# Patient Record
Sex: Male | Born: 1980
Health system: Southern US, Community
[De-identification: ages and names within clinical notes are randomized; demographics above are authoritative.]

## PROBLEM LIST (undated history)

## (undated) DIAGNOSIS — F319 Bipolar disorder, unspecified: Secondary | ICD-10-CM

## (undated) DIAGNOSIS — A630 Anogenital (venereal) warts: Secondary | ICD-10-CM

## (undated) DIAGNOSIS — F32A Depression, unspecified: Secondary | ICD-10-CM

## (undated) DIAGNOSIS — K219 Gastro-esophageal reflux disease without esophagitis: Secondary | ICD-10-CM

## (undated) DIAGNOSIS — B019 Varicella without complication: Secondary | ICD-10-CM

## (undated) DIAGNOSIS — F329 Major depressive disorder, single episode, unspecified: Secondary | ICD-10-CM

## (undated) HISTORY — DX: Varicella without complication: B01.9

## (undated) HISTORY — DX: Bipolar disorder, unspecified: F31.9

## (undated) HISTORY — PX: HERNIA REPAIR: SHX51

## (undated) HISTORY — DX: Anogenital (venereal) warts: A63.0

## (undated) HISTORY — DX: Major depressive disorder, single episode, unspecified: F32.9

## (undated) HISTORY — DX: Depression, unspecified: F32.A

## (undated) HISTORY — DX: Gastro-esophageal reflux disease without esophagitis: K21.9

---

## 2012-09-28 ENCOUNTER — Ambulatory Visit (INDEPENDENT_AMBULATORY_CARE_PROVIDER_SITE_OTHER): Payer: 59 | Admitting: Adult Health

## 2012-09-28 ENCOUNTER — Encounter: Payer: Self-pay | Admitting: Adult Health

## 2012-09-28 VITALS — BP 116/76 | HR 78 | Temp 98.5°F | Resp 14 | Ht 68.0 in | Wt 212.0 lb

## 2012-09-28 DIAGNOSIS — Z0000 Encounter for general adult medical examination without abnormal findings: Secondary | ICD-10-CM

## 2012-09-28 DIAGNOSIS — Z8619 Personal history of other infectious and parasitic diseases: Secondary | ICD-10-CM

## 2012-09-28 DIAGNOSIS — N5089 Other specified disorders of the male genital organs: Secondary | ICD-10-CM | POA: Insufficient documentation

## 2012-09-28 DIAGNOSIS — Z23 Encounter for immunization: Secondary | ICD-10-CM | POA: Insufficient documentation

## 2012-09-28 DIAGNOSIS — N508 Other specified disorders of male genital organs: Secondary | ICD-10-CM

## 2012-09-28 DIAGNOSIS — F32A Depression, unspecified: Secondary | ICD-10-CM | POA: Insufficient documentation

## 2012-09-28 DIAGNOSIS — Z113 Encounter for screening for infections with a predominantly sexual mode of transmission: Secondary | ICD-10-CM

## 2012-09-28 DIAGNOSIS — A630 Anogenital (venereal) warts: Secondary | ICD-10-CM

## 2012-09-28 DIAGNOSIS — F329 Major depressive disorder, single episode, unspecified: Secondary | ICD-10-CM | POA: Insufficient documentation

## 2012-09-28 MED ORDER — VALACYCLOVIR HCL 500 MG PO TABS: 500.0000 mg | ORAL_TABLET | Freq: Every day | ORAL | Status: DC

## 2012-09-28 MED ORDER — ESOMEPRAZOLE MAGNESIUM 40 MG PO CPDR: 40.0000 mg | DELAYED_RELEASE_CAPSULE | Freq: Every day | ORAL | Status: DC

## 2012-09-28 MED ORDER — CITALOPRAM HYDROBROMIDE 20 MG PO TABS: 20.0000 mg | ORAL_TABLET | Freq: Every day | ORAL | Status: DC

## 2012-09-28 NOTE — Assessment & Plan Note (Addendum)
Two warts noted on testicles. Culture sent. Patient has had multiple warts in genitals including shaft of penis. STD w/u sent.

## 2012-09-28 NOTE — Assessment & Plan Note (Signed)
Right testicular movable mass palpated. Refer to urology for further w/u.

## 2012-09-28 NOTE — Assessment & Plan Note (Signed)
Vaccine to be given today.

## 2012-09-28 NOTE — Assessment & Plan Note (Signed)
Patient has had feelings of depression for years. Highly functional. Works daily. He reports violent outbursts. No physical injury to self or others. Will start trials of Celexa. Refer to psychiatry for evaluation and help with management.

## 2012-09-28 NOTE — Progress Notes (Signed)
  Subjective:    Patient ID: Nathaniel Ball, male    DOB: [birth year], 32 y.o.   MRN: [id number]  HPI  Patient is a 32 y/o male who presents to clinic to establish care. He recently moved to this area with his wife and daughter. Patient reports a hx of oral herpes with several outbreaks yearly, GERD, depression with some issues with outbursts of anger, genital warts.  Patient was in the [employer] and served for 3.5 years. He completed his tour in November 2005.  Patient has not received his flu vaccine and does not wish to get one. He is due for his Tdap and is agreeable to this.   Review of Systems  Constitutional: Negative.   HENT: Negative.   Eyes: Negative.   Respiratory: Negative.   Cardiovascular: Negative.   Genitourinary: Negative for frequency, hematuria, flank pain, discharge, difficulty urinating and testicular pain.       Genital warts. Testicular lump.  Musculoskeletal:       Right hamstring tightness every morning upon awakening. This subsides once he gets up and moves around.  Neurological: Negative.   Psychiatric/Behavioral: Positive for decreased concentration. Negative for suicidal ideas and hallucinations. The patient is hyperactive. The patient is not nervous/anxious.        Problems with outbursts of anger. Feeling depressed.        Objective:   Physical Exam  Constitutional: He is oriented to person, place, and time. He appears well-developed and well-nourished. No distress.  HENT:  Head: Normocephalic and atraumatic.  Eyes: Conjunctivae normal are normal. Pupils are equal, round, and reactive to light.  Cardiovascular: Normal rate, regular rhythm and normal heart sounds.  Exam reveals no gallop.   No murmur heard. Pulmonary/Chest: Effort normal and breath sounds normal. No respiratory distress. He has no wheezes. He has no rales. He exhibits no tenderness.  Abdominal: Soft. Bowel sounds are normal. He exhibits no mass. There is no tenderness. There is no  rebound.  Genitourinary: Penis normal. No penile tenderness.       Several testicular warts. Right testicular movable nodule.  Musculoskeletal: Normal range of motion. He exhibits no edema and no tenderness.  Lymphadenopathy:    He has no cervical adenopathy.  Neurological: He is alert and oriented to person, place, and time. No cranial nerve deficit. Coordination normal.  Skin: Skin is warm and dry.  Psychiatric: He has a normal mood and affect. His behavior is normal. Judgment and thought content normal.        Assessment & Plan:

## 2012-09-28 NOTE — Patient Instructions (Addendum)
  Thank you for choosing [facility] for your health care needs.  Please return within the next week or two for you fasting blood work. You need to be fasting for 8 hours prior.  I am referring you to Psychiatry for evaluation and management of depression and ADD.  I am also referring you to Urology for evaluation of testicular nodule.  We will notify you of your lab results once we receive the results.  I have sent your prescriptions we discussed to your pharmacy.

## 2012-09-28 NOTE — Assessment & Plan Note (Signed)
Patient presents to establish care. Recently moved to this state from [place]. Labs ordered today include cbc w/diff, lipid panel, cmet. Patient is not fasting so he will return within the next week or so for fasting labs.

## 2012-09-28 NOTE — Assessment & Plan Note (Signed)
Reports multiple oral outbreaks yearly. None in genitals. Start Valtrex for suppression.

## 2012-09-29 LAB — HSV(HERPES SIMPLEX VRS) I + II AB-IGM: Herpes Simplex Vrs I&II-IgM Ab (EIA): 0.46 INDEX

## 2012-09-29 LAB — HIV ANTIBODY (ROUTINE TESTING W REFLEX): HIV: NONREACTIVE

## 2012-09-29 MED ORDER — TETANUS-DIPHTH-ACELL PERTUSSIS 5-2.5-18.5 LF-MCG/0.5 IM SUSP
0.5000 mL | Freq: Once | INTRAMUSCULAR | Status: AC
  Administered 2012-09-29: 0.5 mL via INTRAMUSCULAR

## 2012-09-30 LAB — CHLAMYDIA/GONOCOCCUS/TRICHOMONAS, NAA: Gonococcus by NAA: NEGATIVE

## 2012-10-01 ENCOUNTER — Other Ambulatory Visit: Payer: Self-pay | Admitting: Adult Health

## 2012-10-01 MED ORDER — PODOFILOX 0.5 % EX GEL: Freq: Two times a day (BID) | CUTANEOUS | Status: DC

## 2012-10-01 NOTE — Progress Notes (Signed)
Ordered Podofilox gel 0.5% - apply bid x 3 days then 4 days of no treatment. May repeat x 3 cycles.

## 2012-10-02 ENCOUNTER — Telehealth: Payer: Self-pay | Admitting: *Deleted

## 2012-10-02 NOTE — Telephone Encounter (Signed)
Called patient no answer. Message was left

## 2012-10-12 ENCOUNTER — Other Ambulatory Visit (INDEPENDENT_AMBULATORY_CARE_PROVIDER_SITE_OTHER): Payer: 59

## 2012-10-12 DIAGNOSIS — Z0000 Encounter for general adult medical examination without abnormal findings: Secondary | ICD-10-CM

## 2012-10-13 LAB — LDL CHOLESTEROL, DIRECT: Direct LDL: 105 mg/dL

## 2012-10-13 LAB — COMPREHENSIVE METABOLIC PANEL
Albumin: 4.4 g/dL (ref 3.5–5.2)
Alkaline Phosphatase: 75 U/L (ref 39–117)
BUN: 10 mg/dL (ref 6–23)
CO2: 26 mEq/L (ref 19–32)
Calcium: 9.4 mg/dL (ref 8.4–10.5)
GFR: 117.27 mL/min (ref >60.00)
Glucose, Bld: 83 mg/dL (ref 70–99)
Potassium: 3.8 mEq/L (ref 3.5–5.1)

## 2012-10-13 LAB — LIPID PANEL
HDL: 43.3 mg/dL (ref >39.00)
LDL Cholesterol: 113 mg/dL — ABNORMAL HIGH (ref 0–99)
Total CHOL/HDL Ratio: 4
Triglycerides: 70 mg/dL (ref 0.0–149.0)
VLDL: 14 mg/dL (ref 0.0–40.0)

## 2012-10-13 LAB — CBC WITH DIFFERENTIAL/PLATELET
Basophils Relative: 0.2 % (ref 0.0–3.0)
Eosinophils Relative: 1.9 % (ref 0.0–5.0)
Hemoglobin: 14.1 g/dL (ref 13.0–17.0)
Monocytes Absolute: 0.4 10*3/uL (ref 0.1–1.0)
Monocytes Relative: 7.4 % (ref 3.0–12.0)
Neutrophils Relative %: 67.3 % (ref 43.0–77.0)

## 2012-11-19 ENCOUNTER — Other Ambulatory Visit: Payer: Self-pay | Admitting: Adult Health

## 2012-11-19 MED ORDER — CITALOPRAM HYDROBROMIDE 40 MG PO TABS: 40.0000 mg | ORAL_TABLET | Freq: Every day | ORAL | Status: DC

## 2013-01-27 ENCOUNTER — Encounter: Payer: Self-pay | Admitting: Adult Health

## 2013-01-27 ENCOUNTER — Ambulatory Visit (INDEPENDENT_AMBULATORY_CARE_PROVIDER_SITE_OTHER): Payer: BC Managed Care – PPO | Admitting: Adult Health

## 2013-01-27 VITALS — BP 110/62 | HR 87 | Temp 98.6°F | Resp 12 | Wt 226.5 lb

## 2013-01-27 DIAGNOSIS — M549 Dorsalgia, unspecified: Secondary | ICD-10-CM | POA: Insufficient documentation

## 2013-01-27 DIAGNOSIS — J029 Acute pharyngitis, unspecified: Secondary | ICD-10-CM | POA: Insufficient documentation

## 2013-01-27 DIAGNOSIS — R109 Unspecified abdominal pain: Secondary | ICD-10-CM

## 2013-01-27 MED ORDER — AZITHROMYCIN 250 MG PO TABS: ORAL_TABLET | ORAL | Status: DC

## 2013-01-27 MED ORDER — CEFTRIAXONE SODIUM 1 G IJ SOLR
1.0000 g | Freq: Once | INTRAMUSCULAR | Status: AC
  Administered 2013-01-27: 1 g via INTRAMUSCULAR

## 2013-01-27 NOTE — Progress Notes (Signed)
  Subjective:    Patient ID: Nathaniel Ball, male    DOB: [birth year], 32 y.o.   MRN: [id number]  HPI  Patient presents to clinic with c/o sore throat. He is unable to recall when his sore throat began. He knows it is greater than 1 week. It is painful to swallow. He has not noted any exudate. He denies any sick contacts. Denies fever or chills. He has not tried any over-the-counter medications.  Patient complains of upper right scapula pain and left middle back pain which has been ongoing for greater than 3 months. Pain is improved with rest. Pain is worse with certain movements. Pain is not constant. Work involved lifting up to 50 lbs. He reports that the scapula pain began with repetitive motions required with his work. He does not recall when the left middle back pain began. He denies hematuria, fever or difficulty urinating. He reports no personal history of kidney stones.   Current Outpatient Prescriptions on File Prior to Visit  Medication Sig Dispense Refill  . esomeprazole (NEXIUM) 40 MG capsule Take 1 capsule (40 mg total) by mouth daily.  30 capsule  6  . valACYclovir (VALTREX) 500 MG tablet Take 1 tablet (500 mg total) by mouth daily.  30 tablet  6   No current facility-administered medications on file prior to visit.     Review of Systems  Constitutional: Negative for fever and chills.  HENT: Positive for sore throat. Negative for congestion, mouth sores and trouble swallowing.   Respiratory: Negative for cough, shortness of breath and wheezing.   Cardiovascular: Negative for chest pain.  Musculoskeletal: Positive for back pain. Negative for joint swelling and arthralgias.       Objective:   Physical Exam  Constitutional: He is oriented to person, place, and time.  Overweight male in no apparent distress  HENT:  Head: Normocephalic and atraumatic.  Erythema of the pharynx without exudate.  Cardiovascular: Normal rate, regular rhythm and normal heart sounds.  Exam reveals  no gallop.   No murmur heard. Pulmonary/Chest: Effort normal and breath sounds normal. No respiratory distress. He has no wheezes. He has no rales.  Musculoskeletal: He exhibits tenderness. He exhibits no edema.  Tenderness with palpation over left scapula area. There is no costovertebral tenderness.  Lymphadenopathy:    He has no cervical adenopathy.  Neurological: He is alert and oriented to person, place, and time.  Skin: Skin is warm and dry.  Psychiatric: He has a normal mood and affect. His behavior is normal.          Assessment & Plan:

## 2013-01-27 NOTE — Assessment & Plan Note (Signed)
Rocephin IM given in the office. Azithromycin to start tomorrow. Patient is to return to clinic if symptoms do not improve with treatment.

## 2013-01-27 NOTE — Assessment & Plan Note (Signed)
2 separate areas of back pain. Suspect back pain is muscular in origin given his presentation and report. Right middle back pain may be compensatory versus UTI versus kidney stone. Urinalysis sent. Start ibuprofen 600 mg every 6 hours for the next 3-4 days. Recommended ice alternating with heat for 15 minute intervals 3-4 times a day. If symptoms do not improve may benefit from physical therapy.

## 2013-01-27 NOTE — Patient Instructions (Addendum)
  We are giving you a shot of ceftriaxone 1gm today.  Then start the Azithromycin tomorrow as instructed - 2 tablets tomorrow then 1 tablet daily for the next 4 days.  The office will contact you once we get the results of your urinalysis.  For your back pain, take ibuprofen 600 mg every 6 hours for 3-4 days.  Apply ice alternating with heat for 15 min at a time. Try to do this at least 2-3 times daily for the next 3-4 days.

## 2013-01-28 LAB — URINALYSIS, ROUTINE W REFLEX MICROSCOPIC
Ketones, ur: NEGATIVE
Leukocytes, UA: NEGATIVE
Specific Gravity, Urine: >=1.03 (ref 1.000–1.030)
Urine Glucose: NEGATIVE
pH: 6 (ref 5.0–8.0)

## 2013-01-29 ENCOUNTER — Other Ambulatory Visit: Payer: Self-pay | Admitting: Adult Health

## 2013-01-29 DIAGNOSIS — R3129 Other microscopic hematuria: Secondary | ICD-10-CM

## 2013-04-27 ENCOUNTER — Ambulatory Visit (INDEPENDENT_AMBULATORY_CARE_PROVIDER_SITE_OTHER): Payer: BC Managed Care – PPO | Admitting: Adult Health

## 2013-04-27 ENCOUNTER — Encounter: Payer: Self-pay | Admitting: Adult Health

## 2013-04-27 VITALS — BP 110/64 | HR 83 | Temp 98.3°F | Resp 12 | Wt 224.0 lb

## 2013-04-27 DIAGNOSIS — K625 Hemorrhage of anus and rectum: Secondary | ICD-10-CM | POA: Insufficient documentation

## 2013-04-27 DIAGNOSIS — R109 Unspecified abdominal pain: Secondary | ICD-10-CM

## 2013-04-27 DIAGNOSIS — R079 Chest pain, unspecified: Secondary | ICD-10-CM

## 2013-04-27 DIAGNOSIS — R0789 Other chest pain: Secondary | ICD-10-CM

## 2013-04-27 NOTE — Assessment & Plan Note (Addendum)
Patient reports ongoing intermittent chest pain for approximately 2 months. Appears to be muscular in origin. EKG normal. Check labs: troponin, crp, sed rate, bmet, cbc. On exam there was point tenderness medial to the left nipple. He works out and does chest exercises. Recommend continue to work out but avoid chest exercise for a few weeks. Take ibuprofen 600 mg tid x 7-10 days. If pain persists, refer for cardiac eval.

## 2013-04-27 NOTE — Patient Instructions (Addendum)
  Failure chest pain:    I am doing some blood work. Once the results are available we will contact you  Your EKG was normal.  Take ibuprofen 600 mg 3 times a day for approximately 7-10 days.  While exercising, avoid flies, bench press - this is only for a short period of time  I suspect this pain is muscular in nature. If symptoms continue I will refer you to cardiology.  For the rectal bleeding:   Stool for occult blood was negative - this means there was no blood in the stool in the rectum.  I suspect this is possibly from internal hemorrhoids as I didn't see any externally  You can apply over-the-counter preparation H. Inserts into rectum and apply as indicated.  If you continue to notice this problem, I will refer you to GI for further workup  I am also checking your hemoglobin since you reported you do not look at the toilet and were unable to tell me the quantity of blood.  To evaluate your right flank pain:   I am sending you for a CT of the abdomen to rule out a kidney stone.  The office will contact you with an appointment.

## 2013-04-27 NOTE — Assessment & Plan Note (Signed)
Evaluate for nephrolithiasis. Send for CT. Check bmet.

## 2013-04-27 NOTE — Assessment & Plan Note (Addendum)
Exam did not reveal any external hemorrhoids or immediately inside rectal vault. Hemocult negative. This has resolved per patient. If problem resurfaces will refer to GI. May use preparation H as needed and per directions.

## 2013-04-27 NOTE — Progress Notes (Signed)
  Subjective:    Patient ID: Nathaniel Ball, male    DOB: 1981/07/11, 32 y.o.   MRN: [id number]  HPI  Patient is a pleasant 32 y/o male who presents to clinic with the following concerns:  1) Intermittent chest pain - describes pain on the left side. Pain last ~ 5 min. Occurs spontaneously - not necessarily with activity. Describes pain as dull but enough to know it is present. This has been ongoing for 2 months. Does not recall the very first episode. He is working out. Denies shortness of breath, syncope or near syncope, dizziness. Pain does not radiate.  2) Bright red blood when he wipes rectum. No hx of hemorrhoids that he is aware of. He did not see any blood on the toilet. He describes a small amount on the paper. Denies rectal pain. No pain with sitting. He does report itching. This last occurred 2 weeks ago. He works out and believes there may be some correlation.  3) Right flank pain. Reports pain worse with stretching but also "just there". Family hx of nephrolithiasis. Denies blood in urine. Pain/discomfort is ongoing for 4 months. Denies fever or chills.   Current Outpatient Prescriptions on File Prior to Visit  Medication Sig Dispense Refill  . esomeprazole (NEXIUM) 40 MG capsule Take 1 capsule (40 mg total) by mouth daily.  30 capsule  6  . Multiple Vitamin (MULTIVITAMIN) tablet Take 1 tablet by mouth daily.      . valACYclovir (VALTREX) 500 MG tablet Take 1 tablet (500 mg total) by mouth daily.  30 tablet  6   No current facility-administered medications on file prior to visit.    Review of Systems  Constitutional: Negative for fever and chills.  Cardiovascular: Positive for chest pain.       Describes intermittent pain. Can point to area. No radiating of pain, sob, syncope, dizziness.   Gastrointestinal: Positive for anal bleeding.       Blood when he wipes. Episodes x 2. None seen in ~ 2 weeks. Does not believe he has hemorrhoids.  Genitourinary: Positive for flank pain.  Negative for dysuria, frequency and hematuria.  Neurological: Negative for dizziness, syncope, weakness and light-headedness.  Psychiatric/Behavioral: Negative.   All other systems reviewed and are negative.     Blood pressure 110/64, pulse 83, temperature 98.3 F (36.8 C), temperature source Oral, resp. rate 12, weight 224 lb (101.606 kg), SpO2 97.00%.    Objective:   Physical Exam  Constitutional: He is oriented to person, place, and time.  32 y/o male in NAD  Cardiovascular: Normal rate, regular rhythm, normal heart sounds and intact distal pulses.  Exam reveals no gallop and no friction rub.   No murmur heard. EKG without abnormality noted  Pulmonary/Chest: Effort normal and breath sounds normal. No respiratory distress. He has no wheezes. He has no rales.  Point tenderness left chest area, medial to nipple. No palpable nodule appreciated.  Genitourinary: Rectum normal.  Mild costovertebral discomfort with palpation over right flank.  Neurological: He is alert and oriented to person, place, and time.  Skin: Skin is dry.  Psychiatric: He has a normal mood and affect. His behavior is normal. Judgment and thought content normal.      Assessment & Plan:

## 2013-04-28 ENCOUNTER — Other Ambulatory Visit: Payer: Self-pay | Admitting: Adult Health

## 2013-04-28 LAB — CBC WITH DIFFERENTIAL/PLATELET
Basophils Absolute: 0 10*3/uL (ref 0.0–0.1)
Basophils Relative: 0.4 % (ref 0.0–3.0)
Eosinophils Absolute: 0.1 10*3/uL (ref 0.0–0.7)
Lymphocytes Relative: 25.8 % (ref 12.0–46.0)
MCHC: 34.5 g/dL (ref 30.0–36.0)
MCV: 91.6 fl (ref 78.0–100.0)
Monocytes Absolute: 0.5 10*3/uL (ref 0.1–1.0)
Neutrophils Relative %: 64.2 % (ref 43.0–77.0)
Platelets: 171 10*3/uL (ref 150.0–400.0)
RBC: 4.54 Mil/uL (ref 4.22–5.81)

## 2013-04-28 LAB — BASIC METABOLIC PANEL
BUN: 13 mg/dL (ref 6–23)
Chloride: 105 mEq/L (ref 96–112)
GFR: 118.56 mL/min (ref >60.00)
Potassium: 3.9 mEq/L (ref 3.5–5.1)
Sodium: 137 mEq/L (ref 135–145)

## 2013-04-28 LAB — SEDIMENTATION RATE: Sed Rate: 9 mm/hr (ref 0–22)

## 2013-04-28 LAB — C-REACTIVE PROTEIN: CRP: <0.5 mg/dL (ref 0.5–20.0)

## 2013-04-30 ENCOUNTER — Encounter: Payer: Self-pay | Admitting: *Deleted

## 2013-05-05 ENCOUNTER — Ambulatory Visit: Payer: Self-pay | Admitting: Adult Health

## 2013-05-06 ENCOUNTER — Telehealth: Payer: Self-pay | Admitting: *Deleted

## 2013-05-06 NOTE — Telephone Encounter (Signed)
CT Abdomen and pelvis was normal. Notified pt on voicemail.

## 2013-05-17 ENCOUNTER — Encounter: Payer: Self-pay | Admitting: Adult Health

## 2013-05-20 ENCOUNTER — Observation Stay: Payer: Self-pay | Admitting: Internal Medicine

## 2013-05-20 LAB — COMPREHENSIVE METABOLIC PANEL
Anion Gap: 6 — ABNORMAL LOW (ref 7–16)
BUN: 11 mg/dL (ref 7–18)
Bilirubin,Total: 0.3 mg/dL (ref 0.2–1.0)
Calcium, Total: 9.2 mg/dL (ref 8.5–10.1)
Chloride: 107 mmol/L (ref 98–107)
Co2: 25 mmol/L (ref 21–32)
EGFR (Non-African Amer.): >60
Glucose: 92 mg/dL (ref 65–99)
Osmolality: 275 (ref 275–301)
SGOT(AST): 35 U/L (ref 15–37)
Sodium: 138 mmol/L (ref 136–145)
Total Protein: 7.3 g/dL (ref 6.4–8.2)

## 2013-05-20 LAB — CBC
HCT: 41.8 % (ref 40.0–52.0)
HGB: 14.3 g/dL (ref 13.0–18.0)
MCH: 31.3 pg (ref 26.0–34.0)
MCHC: 34.3 g/dL (ref 32.0–36.0)
RDW: 12.6 % (ref 11.5–14.5)

## 2013-05-20 LAB — URINALYSIS, COMPLETE
Bacteria: NONE SEEN
Blood: NEGATIVE
Leukocyte Esterase: NEGATIVE
Nitrite: NEGATIVE
Ph: 6 (ref 4.5–8.0)
Protein: NEGATIVE
RBC,UR: 8 /HPF (ref 0–5)
Specific Gravity: 1.021 (ref 1.003–1.030)
Squamous Epithelial: NONE SEEN
WBC UR: <1 /HPF (ref 0–5)

## 2013-05-20 LAB — CK TOTAL AND CKMB (NOT AT ARMC)
CK, Total: 288 U/L — ABNORMAL HIGH (ref 35–232)
CK-MB: 3.2 ng/mL (ref 0.5–3.6)

## 2013-05-21 ENCOUNTER — Telehealth: Payer: Self-pay | Admitting: Adult Health

## 2013-05-21 LAB — LIPID PANEL
HDL Cholesterol: 48 mg/dL (ref 40–60)
Ldl Cholesterol, Calc: 74 mg/dL (ref 0–100)
Triglycerides: 92 mg/dL (ref 0–200)

## 2013-05-21 LAB — CK TOTAL AND CKMB (NOT AT ARMC)
CK, Total: 203 U/L (ref 35–232)
CK-MB: 2.2 ng/mL (ref 0.5–3.6)

## 2013-05-21 LAB — TROPONIN I
Troponin-I: <0.02 ng/mL
Troponin-I: <0.02 ng/mL

## 2013-05-21 NOTE — Telephone Encounter (Signed)
Left message for pt to return my call.

## 2013-05-21 NOTE — Telephone Encounter (Signed)
Hospital follow up 10.3.14 °

## 2013-05-24 NOTE — Telephone Encounter (Signed)
Left message for pt to return my call.

## 2013-05-27 ENCOUNTER — Encounter: Payer: Self-pay | Admitting: Cardiovascular Disease

## 2013-05-27 ENCOUNTER — Ambulatory Visit (INDEPENDENT_AMBULATORY_CARE_PROVIDER_SITE_OTHER): Payer: BC Managed Care – PPO | Admitting: Cardiovascular Disease

## 2013-05-27 VITALS — BP 110/82 | HR 77 | Ht 67.0 in | Wt 218.5 lb

## 2013-05-27 DIAGNOSIS — R079 Chest pain, unspecified: Secondary | ICD-10-CM

## 2013-05-27 NOTE — Patient Instructions (Addendum)
Your stress test is normal.  Follow up as needed.  

## 2013-05-27 NOTE — Procedures (Signed)
    Treadmill Stress test  Indication: Atypical chest pain.  Baseline Data:  Resting EKG shows NSR with rate of 78 bpm, no significant ST or T wave changes Resting blood pressure of 110/82 mm Hg Stand bruce protocal was used.  Exercise Data:  Patient exercised for 9 min 24 sec,  Peak heart rate of 159 bpm.  This was 85 % of the maximum predicted heart rate. No symptoms of chest pain or lightheadedness were reported at peak stress or in recovery.  Peak Blood pressure recorded was 168/84 Maximal work level: 10.1 METs.  Heart rate at 3 minutes in recovery was 97 bpm. BP response: Normal HR response: Normal  EKG with Exercise: Sinus tachycardia with no significant ST changes  FINAL IMPRESSION: Normal exercise stress test. No significant EKG changes concerning for ischemia. Good exercise tolerance.  Recommendation: The chest pain seems to be noncardiac.

## 2013-05-28 ENCOUNTER — Ambulatory Visit (INDEPENDENT_AMBULATORY_CARE_PROVIDER_SITE_OTHER): Payer: BC Managed Care – PPO | Admitting: Adult Health

## 2013-05-28 ENCOUNTER — Ambulatory Visit: Payer: BC Managed Care – PPO | Admitting: Adult Health

## 2013-05-28 VITALS — BP 108/66 | HR 78 | Temp 98.2°F | Resp 12 | Wt 215.5 lb

## 2013-05-28 DIAGNOSIS — Z09 Encounter for follow-up examination after completed treatment for conditions other than malignant neoplasm: Secondary | ICD-10-CM

## 2013-05-28 NOTE — Progress Notes (Signed)
Subjective:    Patient ID: Nathaniel Ball, male    DOB: [birth year], 32 y.o.   MRN: [id number]  HPI  Pt is 32 yo male who presents to clinic for hospital admission follow up. Pt states he was admitted for chest pain at [facility] from 05/20/13 - 05/21/13. Pt states the morning the pain began, he had drank 2 Red Bulls which he doesn't usually do.  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  Pt states he went to the gym after work and chest pain and anxiety continued. Pt states he went to the ED to be evaluated and was admitted for observation to the telemetry floor. Per discharge summary, patient had 2 normal EKGs, serial troponins negative, total CK 288, 203 and 175 units/L, lipid profile within normal limits, normal chest x-ray without evidence of cardiopulmonary disease, TSH 2.93, magnesium 2.1 mg/dL.   Pt had treadmill stress test with Dr. Kirke Corin yesterday which was normal.  Pt states chest pain has continued intermittently. He denies syncopal type episodes, lightheadedness or dizziness, nausea or vomiting associated with these episodes. He is able to pinpoint the area on his chest where he experiences intermittent pain. He denies shortness of breath or pain with inspiration.  Pt states he performs repetitive movements at work lifting approximately 15 lb objects onto a table and placing them in boxes. He remains concerned about his chest pain and reports that his father had his first MI at age 74 and that his paternal grandfather died suddenly from an MI at age 37. Patient's wife is a Engineer, civil (consulting) at [employer] and she has discussed with patient about whether he should have more aggressive/invasive testing. Patient reports that he has a followup appointment with Dr. Kirke Corin next week.    Past Medical History  Diagnosis Date  . Depression   . Chicken pox   . GERD (gastroesophageal reflux disease)   . Genital warts     2007     Past Surgical History  Procedure  Laterality Date  . Hernia repair      2005     Family History  Problem Relation Age of Onset  . Diabetes Mother   . Cancer Father     prostate  . Heart disease Father 54    MI  . Hypertension Father   . Diabetes Father   . Depression Father   . Heart disease Maternal Grandfather   . Heart disease Paternal Grandfather 56    Died MI     History   Social History  . Marital Status: Married    Spouse Name: Photographer    Number of Children: N/A  . Years of Education: 12   Occupational History  .     Social History Main Topics  . Smoking status: Former Smoker    Types: Cigarettes  . Smokeless tobacco: Current User    Types: Chew  . Alcohol Use: Yes  . Drug Use: No  . Sexual Activity: Not on file   Other Topics Concern  . Not on file   Social History Narrative  . No narrative on file    Current Outpatient Prescriptions on File Prior to Visit  Medication Sig Dispense Refill  . aspirin 81 MG tablet Take 81 mg by mouth daily.      Marland Kitchen esomeprazole (NEXIUM) 40 MG capsule Take 1 capsule (40 mg total) by mouth daily.  30 capsule  6  . Multiple Vitamin (MULTIVITAMIN)  tablet Take 1 tablet by mouth daily.      . valACYclovir (VALTREX) 500 MG tablet TAKE 1 TABLET BY MOUTH EVERY DAY  30 tablet  5  . ziprasidone (GEODON) 60 MG capsule Take 60 mg by mouth daily.       No current facility-administered medications on file prior to visit.     Review of Systems  Constitutional: Negative.   Respiratory: Negative.  Negative for shortness of breath.   Cardiovascular: Positive for chest pain.       Pt reports intermittent left sided chest pain.       Objective:   Physical Exam  Constitutional: He is oriented to person, place, and time. He appears well-developed and well-nourished.  Cardiovascular: Normal rate, regular rhythm and normal heart sounds.   Pulmonary/Chest: Effort normal and breath sounds normal. He exhibits no tenderness.  There is no pinpoint tenderness with  palpation of chest.  Musculoskeletal: Normal range of motion.  Neurological: He is alert and oriented to person, place, and time.  Skin: Skin is warm and dry.     Psychiatric: He has a normal mood and affect. His behavior is normal. Judgment and thought content normal.    BP 108/66  Pulse 78  Temp(Src) 98.2 F (36.8 C) (Oral)  Resp 12  Wt 215 lb 8 oz (97.75 kg)  BMI 33.74 kg/m2  SpO2 97%       Assessment & Plan:

## 2013-05-28 NOTE — Patient Instructions (Addendum)
  You have an appointment with Dr. Kirke Corin on 06/03/13 for follow up.  Please let him know about your family history of early cardiac disease.  Please avoid caffeine especially drinks like red bull  Take aspirin daily as you have been doing.  Take ibuprofen or any other antiinflammatory medication every 6 hours for 1 week.

## 2013-05-30 ENCOUNTER — Encounter: Payer: Self-pay | Admitting: Adult Health

## 2013-05-30 DIAGNOSIS — Z09 Encounter for follow-up examination after completed treatment for conditions other than malignant neoplasm: Secondary | ICD-10-CM | POA: Insufficient documentation

## 2013-05-30 NOTE — Assessment & Plan Note (Addendum)
Patient was admitted to [facility] for observation. Hospital documents reviewed. Intermittent chest pain with normal cardiac workup. Labs normal. Patient had normal treadmill stress test yesterday. Patient is concerned that there may be an underlying cardiac problem given his strong family history of early cardiac disease. His wife, which is a Engineer, civil (consulting), has been questioning whether he should have a catheterization. He has a followup appointment with cardiologist next week. I have encouraged him to discuss this in detail with Dr. Kirke Corin. 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 Instructed patient to take anti-inflammatories such as ibuprofen every 6 hours x1 week to see if this has any impact on his pain. Patient agreed. He is not experiencing any pain during clinic.

## 2013-06-03 ENCOUNTER — Encounter (INDEPENDENT_AMBULATORY_CARE_PROVIDER_SITE_OTHER): Payer: Self-pay

## 2013-06-03 ENCOUNTER — Ambulatory Visit (INDEPENDENT_AMBULATORY_CARE_PROVIDER_SITE_OTHER): Payer: BC Managed Care – PPO | Admitting: Cardiovascular Disease

## 2013-06-03 ENCOUNTER — Encounter: Payer: Self-pay | Admitting: Cardiovascular Disease

## 2013-06-03 ENCOUNTER — Encounter: Payer: Self-pay | Admitting: *Deleted

## 2013-06-03 VITALS — BP 121/78 | HR 75 | Ht 66.0 in | Wt 215.0 lb

## 2013-06-03 DIAGNOSIS — R0789 Other chest pain: Secondary | ICD-10-CM

## 2013-06-03 DIAGNOSIS — R079 Chest pain, unspecified: Secondary | ICD-10-CM

## 2013-06-03 NOTE — Progress Notes (Signed)
Patient ID: Nathaniel Ball, male    DOB: 10/12/1980, 32 y.o.   MRN: [id number]  HPI Comments: 32 yo male with a history of bipolar disorder, history of chronic chest pain, prior stress test in 2011 (myoview) that showed no ischemia,  recentlyadmitted for chest pain at [facility] from 05/20/13 - 05/21/13, outpatient stress treadmill where he achieved heart rate 160 beats per minute with no EKG changes concerning for ischemia, no chest pain symptoms with exertion . Who presents for new patient visit.  Notes indicate that the morning of his chest pain, he had drank 2 Red Bulls which he doesn't usually do.  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  Pt states he went to the gym after work and chest pain and anxiety continued. Pt states he went to the ED to be evaluated and was admitted for observation to the telemetry floor. Workup was negative  In the visit today, he reports having random episodes of pain his left pectoral area. Is able to reproduce his discomfort with palpation. He has tried Aleve and Advil with improvement but reports that he "cannot live on these medications".   He is able to pinpoint the area on his chest where he experiences intermittent pain. He denies shortness of breath or pain with inspiration.  Pt states he performs repetitive movements at work lifting approximately 15 lb objects onto a table and placing them in boxes.   He is not a diabetic, cholesterol is 140, he does have several year smoking history but stopped in 2011.  He reports having a family history, father had his first MI at age 69 and that his paternal grandfather died suddenly from an MI at age 12.   EKG shows normal sinus rhythm with no significant ST or T wave changes, similar to EKG from recent treadmill    Outpatient Encounter Prescriptions as of 06/03/2013  Medication Sig Dispense Refill  . aspirin 81 MG tablet Take 81 mg by mouth daily.      Marland Kitchen  esomeprazole (NEXIUM) 40 MG capsule Take 1 capsule (40 mg total) by mouth daily.  30 capsule  6  . Multiple Vitamin (MULTIVITAMIN) tablet Take 1 tablet by mouth daily.      . valACYclovir (VALTREX) 500 MG tablet TAKE 1 TABLET BY MOUTH EVERY DAY  30 tablet  5  . ziprasidone (GEODON) 60 MG capsule Take 60 mg by mouth daily.       No facility-administered encounter medications on file as of 06/03/2013.     Review of Systems  Constitutional: Negative.   HENT: Negative.   Eyes: Negative.   Respiratory: Negative.   Cardiovascular: Positive for chest pain.  Gastrointestinal: Negative.   Endocrine: Negative.   Musculoskeletal: Negative.   Skin: Negative.   Allergic/Immunologic: Negative.   Neurological: Negative.   Hematological: Negative.   Psychiatric/Behavioral: Negative.   All other systems reviewed and are negative.    BP 121/78  Pulse 75  Ht 5\' 6"  (1.676 m)  Wt 215 lb (97.523 kg)  BMI 34.72 kg/m2  Physical Exam  Nursing note and vitals reviewed. Constitutional: He is oriented to person, place, and time. He appears well-developed and well-nourished.  HENT:  Head: Normocephalic.  Nose: Nose normal.  Mouth/Throat: Oropharynx is clear and moist.  Eyes: Conjunctivae are normal. Pupils are equal, round, and reactive to light.  Neck: Normal range of motion. Neck supple. No JVD present.  Cardiovascular: Normal rate, regular  rhythm, S1 normal, S2 normal, normal heart sounds and intact distal pulses.  Exam reveals no gallop and no friction rub.   No murmur heard. Pulmonary/Chest: Effort normal and breath sounds normal. No respiratory distress. He has no wheezes. He has no rales. He exhibits no tenderness.  Abdominal: Soft. Bowel sounds are normal. He exhibits no distension. There is no tenderness.  Musculoskeletal: Normal range of motion. He exhibits no edema and no tenderness.  Lymphadenopathy:    He has no cervical adenopathy.  Neurological: He is alert and oriented to person,  place, and time. Coordination normal.  Skin: Skin is warm and dry. No rash noted. No erythema.  Psychiatric: He has a normal mood and affect. His behavior is normal. Judgment and thought content normal.      Assessment and Plan

## 2013-06-03 NOTE — Assessment & Plan Note (Signed)
Atypical chest pain, he reports being able to reproduce this at times when he pushes on his left pectoral region. He is a poor historian, by his account, sounds as if his symptoms are very musculoskeletal. Not associated with exertion, seemed to come on at rest, improved with NSAIDs. Given his prior perfusion scan in 2011, recent routine treadmill study suggesting no ischemia, do not think there is an indication for cardiac catheterization at this time as he is suggesting. We did offer routine Myoview if symptoms are associated with exertion concerning for angina. Perhaps the best option for him would be a calcium score which could be done in [place]. This can be set up through our office. This would give him a sense of any underlying CAD.  Currently does not have any significant risk factors.

## 2013-06-03 NOTE — Patient Instructions (Signed)
You are doing well. No medication changes were made.  Please research Calcium score (cardiac calcium score) Score would range from 0 (very good, no plaque) to >2000 Call the office if you would like to schedule this scan in [place]  Please call us if you have new issues that need to be addressed before your next appt.

## 2013-11-03 ENCOUNTER — Other Ambulatory Visit: Payer: Self-pay | Admitting: Adult Health

## 2014-03-14 ENCOUNTER — Ambulatory Visit (INDEPENDENT_AMBULATORY_CARE_PROVIDER_SITE_OTHER): Payer: BC Managed Care – PPO | Admitting: Adult Health

## 2014-03-14 ENCOUNTER — Encounter: Payer: Self-pay | Admitting: Adult Health

## 2014-03-14 VITALS — BP 100/70 | HR 94 | Temp 98.8°F | Resp 14 | Ht 68.0 in | Wt 218.5 lb

## 2014-03-14 DIAGNOSIS — H6593 Unspecified nonsuppurative otitis media, bilateral: Secondary | ICD-10-CM

## 2014-03-14 DIAGNOSIS — J029 Acute pharyngitis, unspecified: Secondary | ICD-10-CM

## 2014-03-14 DIAGNOSIS — H6590 Unspecified nonsuppurative otitis media, unspecified ear: Secondary | ICD-10-CM

## 2014-03-14 MED ORDER — AMOXICILLIN-POT CLAVULANATE 875-125 MG PO TABS: 1.0000 | ORAL_TABLET | Freq: Two times a day (BID) | ORAL | Status: DC

## 2014-03-14 MED ORDER — FLUTICASONE PROPIONATE 50 MCG/ACT NA SUSP
2.0000 | Freq: Every day | NASAL | Status: DC
Start: 2014-03-14 — End: 2015-01-27

## 2014-03-14 NOTE — Patient Instructions (Signed)
  Start Augmentin twice daily for 7 days.  Take over the counter decongestant such as Children's Dimetapp Elixir for 1 week.  Use flonase 2 sprays into each nostril daily.  Avoid all tobacco products.

## 2014-03-14 NOTE — Progress Notes (Deleted)
Patient ID: Nathaniel GumKyle Ball, male   DOB: 07-27-1981, 33 y.o.   MRN: [id number]   Subjective:    Patient ID: Nathaniel Ball, male    DOB: 07-27-1981, 33 y.o.   MRN: [id number]  HPI    Past Medical History  Diagnosis Date  . Depression   . Chicken pox   . GERD (gastroesophageal reflux disease)   . Genital warts     2007  . Bipolar disorder     Current Outpatient Prescriptions on File Prior to Visit  Medication Sig Dispense Refill  . aspirin 81 MG tablet Take 81 mg by mouth daily.      Marland Kitchen. esomeprazole (NEXIUM) 40 MG capsule Take 1 capsule (40 mg total) by mouth daily.  30 capsule  6  . Multiple Vitamin (MULTIVITAMIN) tablet Take 1 tablet by mouth daily.      . valACYclovir (VALTREX) 500 MG tablet TAKE 1 TABLET BY MOUTH EVERY DAY  30 tablet  5  . ziprasidone (GEODON) 60 MG capsule Take 60 mg by mouth daily.       No current facility-administered medications on file prior to visit.     Review of Systems     Objective:  BP 100/70  Pulse 94  Temp(Src) 98.8 F (37.1 C) (Oral)  Resp 14  Ht 5\' 8"  (1.727 m)  Wt 218 lb 8 oz (99.111 kg)  BMI 33.23 kg/m2  SpO2 97%   Physical Exam        Assessment & Plan:

## 2014-03-14 NOTE — Progress Notes (Signed)
Subjective:    Patient ID: Nathaniel Ball, male    DOB: Dec 05, 1980, 33 y.o.   MRN: [id number]  HPI  33 yo male presents today for ear fullness and lab follow-up. The ear fullness has been going on for about a month with addition of sore throat. Denies any sickness or congestion. If he moves his jaw he can feel it in his ears. Denies popping. Denies use of nasal sprays or decongestants. Chews tobacco.  Had labs completed with Dr. Maryruth BunKapur who follows him for depression. Does not have lab results with him. Indicates his wife wants him to get them checked out. He is a poor historian and not able to give me adequate information. Reports that no changes have been made to his medication and he does not recall any particular instructions from Dr. Maryruth BunKapur.  Past Medical History  Diagnosis Date  . Depression   . Chicken pox   . GERD (gastroesophageal reflux disease)   . Genital warts     2007  . Bipolar disorder     Current Outpatient Prescriptions on File Prior to Visit  Medication Sig Dispense Refill  . aspirin 81 MG tablet Take 81 mg by mouth daily.      Marland Kitchen. esomeprazole (NEXIUM) 40 MG capsule Take 1 capsule (40 mg total) by mouth daily.  30 capsule  6  . Multiple Vitamin (MULTIVITAMIN) tablet Take 1 tablet by mouth daily.      . valACYclovir (VALTREX) 500 MG tablet TAKE 1 TABLET BY MOUTH EVERY DAY  30 tablet  5  . ziprasidone (GEODON) 60 MG capsule Take 60 mg by mouth daily.       No current facility-administered medications on file prior to visit.     Review of Systems  Constitutional: Negative.   HENT: Positive for sore throat. Negative for congestion.        Slightly muffled sounds on occasion  Respiratory: Negative.   Cardiovascular: Negative.   Psychiatric/Behavioral: Negative.   All other systems reviewed and are negative.      Objective:  BP 100/70  Pulse 94  Temp(Src) 98.8 F (37.1 C) (Oral)  Resp 14  Ht 5\' 8"  (1.727 m)  Wt 218 lb 8 oz (99.111 kg)  BMI 33.23 kg/m2   SpO2 97%   Physical Exam  Constitutional: He is oriented to person, place, and time.  HENT:  Mouth/Throat: Posterior oropharyngeal erythema present.  TM membranes are pearly gray with appropriate light reflex. Left canal appears slightly reddened. Mild bulging in right ear noted.  Cardiovascular: Normal rate, regular rhythm and normal heart sounds.   Pulmonary/Chest: Effort normal and breath sounds normal.  Neurological: He is alert and oriented to person, place, and time.  Skin: Skin is warm and dry.  Psychiatric: He has a normal mood and affect. His behavior is normal. Judgment and thought content normal.       Assessment & Plan:  1. Acute pharyngitis, unspecified pharyngitis type Start augmentin as described below. Salt-water gargles as needed.   - amoxicillin-clavulanate (AUGMENTIN) 875-125 MG per tablet; Take 1 tablet by mouth 2 (two) times daily.  Dispense: 14 tablet; Refill: 0  2. Middle ear effusion, bilateral Mild effusion of bilateral ears noted. Start Flonase and augmentin as described below. Take OTC decongestants for 1 week. Avoid all tobacco products. Maintain adequate hydration.    - amoxicillin-clavulanate (AUGMENTIN) 875-125 MG per tablet; Take 1 tablet by mouth 2 (two) times daily.  Dispense: 14 tablet; Refill: 0 - fluticasone (FLONASE) 50  MCG/ACT nasal spray; Place 2 sprays into both nostrils daily.  Dispense: 16 g; Refill: 2  Lab results obtained from Dr. Maryruth Bun. Lab results reviewed with patient and advised to continue to increase activity and increase fruit and vegetable intake. Labs show risk for diabetes.

## 2014-03-14 NOTE — Progress Notes (Signed)
Pre visit review using our clinic review tool, if applicable. No additional management support is needed unless otherwise documented below in the visit note. 

## 2014-05-11 ENCOUNTER — Other Ambulatory Visit: Payer: Self-pay | Admitting: Adult Health

## 2014-10-10 ENCOUNTER — Ambulatory Visit: Payer: Self-pay | Admitting: Gastroenterology

## 2014-10-31 ENCOUNTER — Encounter: Payer: Self-pay | Admitting: Nurse Practitioner

## 2014-12-16 NOTE — Discharge Summary (Signed)
PATIENT NAME:  Nathaniel Ball, Nathaniel Ball MR#:  [id number] DATE OF BIRTH:  [birth year]  PRESENTING COMPLAINT: Chest discomfort.   DISCHARGE DIAGNOSES:  1.  Chest pain, ruled out with 3 sets of negative cardiac enzymes and normal EKG for acute myocardial infarction.  2.  Bipolar disorder.  3.  Gastroesophageal reflux disease.   CONDITION ON DISCHARGE: Fair.   CODE STATUS: FULL CODE.   MEDICATIONS:  1.  Aspirin 81 mg daily.  2.  Nexium 40 mg daily.  3.  Geodon 60 mg daily.   DIET: Regular.   FOLLOWUP: With Dr. Kirke Ball as outpatient. The office will call for a stress test.   FOLLOWUP: Nathaniel Ball, nurse practitioner, [facility].   DIAGNOSTIC STUDIES:  1.  Cardiac enzymes x3 negative.  2.  EKG: Normal sinus rhythm.  3.  Lipid profile within normal limits.  4.  UA negative for UTI.  5.  CBC and basic comprehensive metabolic panel within normal limits.  6.  Chest X-ray: No acute cardiopulmonary abnormality.  7.  Repeat EKG was negative as well.   BRIEF SUMMARY OF HOSPITAL COURSE: Nathaniel Ball is a 34 year old Caucasian gentleman with history of bipolar disorder and acid reflux who comes to the Emergency Room after he started having some chest discomfort. He is being admitted with:  1.  Chest pain. The patient said he has been having it on and off for a couple of years or more. He has had work-up in the past with a normal stress test about a year ago. He was admitted on telemetry floor, remained in sinus rhythm. Normal EKGs x2. Cardiac enzymes also remained negative. Lipid profile was within normal limits. The patient's chest pain appears atypical; however, the patient's wife, Nathaniel Ball, was concerned. The patient has strong family history of CAD with premature coronary artery disease. I spoke with [facility] Kirke [facility], and the patient will be scheduled by his office for outpatient stress test and further work-up if needed. Both wife and the patient were agreeable to it.  2.  Bipolar  disorder. Continue Geodon.  3.  Acid reflux. Continue Nexium.   The hospital stay otherwise remained stable.   CODE STATUS: REMAINED A FULL CODE.   TIME SPENT: 40 minutes.    ____________________________ Nathaniel HailSona A. Allena KatzPatel, MD sap:np D: 05/24/2013 14:03:37 ET T: 05/24/2013 15:16:57 ET JOB#: [id number]  cc: Nathaniel Ailes A. Allena KatzPatel, MD, <Dictator> Nathaniel Emachel Rey, NP Jerolyn CenterMuhammad A. Nathaniel CorinArida, MD Willow OraSONA A Nadie Fiumara MD ELECTRONICALLY SIGNED 05/25/2013 14:13

## 2014-12-16 NOTE — H&P (Signed)
PATIENT NAME:  Eliseo GumSTRUNK, Reinhold MR#:  [id number] DATE OF BIRTH:  [birth year]  DATE OF ADMISSION:  05/20/2013  REFERRING PHYSICIAN: Dr. Minna AntisKevin Paduchowski   PRIMARY CARE PHYSICIAN:  Dr. Isidoro Donningai.    CHIEF COMPLAINT:  Chest pain.   HISTORY OF PRESENT ILLNESS:  This is a 34 year old gentleman with past medical history of tobacco abuse, bipolar disorder, not otherwise specified, as well as gastroesophageal reflux disease, who is presenting with chest pain. He states that his pain has been intermittent throughout the day, but lasting a total of 14 hours thus far.  He states that after drinking 2 to 3 of Red Bull, he noticed 2 out of 10 in intensity chest pain located over his left chest which was nonradiating, with no associated symptoms and did not interfere with his daily activities. This lasted for a few hours until he went to the gym to exercise. Before exercising, he noticed that he felt anxious but was able to complete his exercise without any worsening of chest pain or any further symptoms. After completing his exercise routine, he went back home and noted that he still had chest pain, but that increased in intensity 5 out of 10 in a similar in location. He described as only has soreness and did have associated diaphoresis and given nitroglycerin which reduced his pain to 2 out of 10. Currently still complains of chest pain 2 out of 10 in intensity. Once again, he still has soreness in the Emergency Department. EKG and initial cardiac enzymes are within normal limits. As far as his chest pain is concerned, he has been worked up multiple times by his PCP who has never found a cardiac etiology of pain. They want as far as having stress testing for approximately one year ago, which was within normal limits as reported by the patient and family at bedside. He denies any shortness of breath, palpitations, orthopnea, lower extremity edema or exertional symptoms. He has been having chest pain intermittently for  approximately two years now.    REVIEW OF SYSTEMS: CONSTITUTIONAL: Denies fevers, chills, fatigue, weakness.  EYES: Denies blurred vision or eye pain.  ENT: Denies ear pain, discharge dysphagia.  RESPIRATORY: Denies cough, shortness of breath, wheeze.  CARDIOVASCULAR: Chest pain as described above. Denies any palpitations, orthopnea, edema, dyspnea on exertion.   GASTROINTESTINAL: Denies any nausea, vomiting, diarrhea, abdominal pain.  GENITOURINARY: Denies hematuria or dysuria.  ENDOCRINE: Denies any nocturia or thyroid problems, heat or cold intolerance or increased sweating.  HEMATOLOGIC/LYMPHATIC: He denies any easy bruising or bleeding.  SKIN: Denies any rashes or lesions.  MUSCULOSKELETAL: Denies neck, back, shoulder and knee pain. Denies any arthritis.  NEUROLOGIC: Denies any paralysis or paresthesias.  PSYCHIATRIC: Anxious symptoms, as above, which has resolved. Denies any depressive symptoms.  Otherwise, full review of systems performed by me and is negative.   PAST MEDICAL HISTORY: Remote tobacco abuse, bipolar disorder, not otherwise specified, gastroesophageal reflux disease.   FAMILY HISTORY: Significant for early onset cardiovascular disease including his father, who had a STEMI at age 35. Grandfather who had a heart attack at 8543, great grandfather had a heart attack at 5555.    SOCIAL HISTORY: He has remote history of tobacco abuse, is ex-smoker. He does use smokeless tobacco currently, about half a can daily,  occasional alcohol use. He is employed and married.    ALLERGIES: NO KNOWN DRUG ALLERGIES.   HOME MEDICATIONS: Geodon 60 mg p.o. daily, Nexium 40 mg p.o. daily.   PHYSICAL EXAMINATION: VITAL SIGNS: Temperature  97.8 degrees Fahrenheit, heart rate 71, respirations 16, blood pressure 111/68, saturating 98% on room air, BMI 33.1. Weight 95.7 kg.  GENERAL: Well-nourished, well-developed gentleman in no acute distress.  HEAD: Normocephalic, atraumatic.  EYES: Pupils are  reactive light extraocular muscles intact. No sclerae icterus.  MOUTH: Moist mucosal membranes. Dentition intact. No abscess noted.  EAR, NOSE, AND THROAT: Throat is clear without exudate, no external lesions.  NECK: Supple. No thyromegaly or nodules appreciated. No JVD.   CARDIOVASCULAR: S1, S2, regular rate and rhythm. No murmurs, rubs or gallops. No edema noted. Pedal pulses 2+ bilaterally.  PULMONARY: Clear to auscultation bilaterally without wheezes, rales or rhonchi. No use of accessory muscles, good air entry bilaterally. He has minimal reproducible tenderness on palpation of the left chest. He states the intensity is much less than his chest pain which brought him to the hospital.  ABDOMEN:  Soft, nontender, nondistended. No masses. No hepatosplenomegaly, positive bowel sounds.  MUSCULOSKELETAL: No swelling, clubbing, edema, range of motion full in all extremities.  NEUROLOGIC: Cranial nerves II through XII intact. No gross neurological deficits. Sensation intact, reflexes intact.  SKIN: No ulcerations, lesions or rashes noted. No cyanosis. Warm and dry turgor is intact.  PSYCHIATRIC: Mood and affect within normal limits. Alert, oriented x 3. Insight and judgment are intact.   LABORATORY DATA: Sodium 130, potassium 3.9, chloride 107, bicarbonate 25, BUN 11, creatinine 0.77, glucose 92, calcium 9.2, troponin I less than 0.02. WBC 8.7, hemoglobin 14.3, platelets 175. Urinalysis negative for signs of infection. He does have 2+ ketones, no acidosis or anion gap.   Chest x-ray: No acute cardiopulmonary process.   EKG: Normal sinus rhythm, heart rate 74. No ST or T abnormalities.   ASSESSMENT AND PLAN:  72.  A 34 year old gentleman with history of remote tobacco abuse, bipolar disorder, not otherwise specified and gastroesophageal reflux disease, as well as a family history significant for coronary artery disease of her early onset, presenting with atypical chest admitted to observation.  Continue cardiac telemetry, trend cardiac enzymes every 8 hours as well as check lipid panel. He has had a stress test that was normal one year ago.  In relation to symptoms it is possible that The Red Bullcould have caused an arrhythmia. We will check a magnesium level as well as TSH. Given his age and risk factors, I feel a stress test will be more likely false positive than anything else and will not order a repeat stress test at this time.  2.  Bipolar disorder, not otherwise specified, continue Geodon.  3.  Gastroesophageal reflux disease.  Continue Nexium.  4.  Deep venous thrombosis prophylactic.  Heparin subcutaneous.   CODE STATUS:  The patient is full code.   TIME SPENT: 45 minutes    ____________________________ Cletis Athens. Allard Lightsey, MD dkh:cc D: 05/20/2013 22:01:07 ET T: 05/20/2013 22:39:57 ET JOB#: [id number]  cc: Cletis Athens. Lillyian Heidt, MD, <Dictator> Santana Gosdin Synetta Shadow MD ELECTRONICALLY SIGNED 05/22/2013 21:52

## 2015-01-27 ENCOUNTER — Ambulatory Visit (INDEPENDENT_AMBULATORY_CARE_PROVIDER_SITE_OTHER): Payer: BLUE CROSS/BLUE SHIELD | Admitting: Nurse Practitioner

## 2015-01-27 ENCOUNTER — Encounter: Payer: Self-pay | Admitting: Nurse Practitioner

## 2015-01-27 ENCOUNTER — Encounter (INDEPENDENT_AMBULATORY_CARE_PROVIDER_SITE_OTHER): Payer: Self-pay

## 2015-01-27 VITALS — BP 110/72 | HR 66 | Temp 98.2°F | Resp 14 | Ht 67.0 in | Wt 235.0 lb

## 2015-01-27 DIAGNOSIS — F319 Bipolar disorder, unspecified: Secondary | ICD-10-CM

## 2015-01-27 DIAGNOSIS — J029 Acute pharyngitis, unspecified: Secondary | ICD-10-CM | POA: Diagnosis not present

## 2015-01-27 DIAGNOSIS — Z0000 Encounter for general adult medical examination without abnormal findings: Secondary | ICD-10-CM | POA: Diagnosis not present

## 2015-01-27 NOTE — Progress Notes (Signed)
Subjective:    Patient ID: Nathaniel Ball, male    DOB: 1981-05-26, 34 y.o.   MRN: [id number]  HPI  Mr. Upchurch is a 34 yo male here for an annual exam.   1) Health Maintenance-   Diet-  No formal   Exercise- No formal   Immunizations- UTD  Eye Exam- UTD  Dental Exam- UTD  2) Chronic Problems-  Smokeless tobacco user- fustrated and uses it   Depression/Bipolar disorder- Stable   GERD- Nexium- stable no other concerns   Review of Systems  Constitutional: Negative for fever, chills, diaphoresis and fatigue.  HENT: Negative for tinnitus and trouble swallowing.        Upper left tooth- Wisdom- discomfort  Eyes: Negative for visual disturbance.  Respiratory: Negative for chest tightness, shortness of breath and wheezing.   Cardiovascular: Negative for chest pain, palpitations and leg swelling.  Gastrointestinal: Positive for diarrhea. Negative for nausea, vomiting and constipation.       Looser stools 3 months approx.   Genitourinary: Negative for difficulty urinating.  Musculoskeletal: Positive for back pain. Negative for myalgias and neck pain.       Midline low back pain and right side paraspinal muscles on right   Skin: Negative for rash.  Neurological: Negative for dizziness, weakness, numbness and headaches.  Hematological: Does not bruise/bleed easily.  Psychiatric/Behavioral: Negative for suicidal ideas and sleep disturbance. The patient is not nervous/anxious.    Past Medical History  Diagnosis Date  . Depression   . Chicken pox   . GERD (gastroesophageal reflux disease)   . Genital warts     2007  . Bipolar disorder     History   Social History  . Marital Status: Married    Spouse Name: Photographer  . Number of Children: N/A  . Years of Education: 12   Occupational History  .     Social History Main Topics  . Smoking status: Former Smoker    Types: Cigarettes  . Smokeless tobacco: Current User    Types: Chew  . Alcohol Use: Yes     Comment: social   . Drug Use: No  . Sexual Activity: Not on file   Other Topics Concern  . Not on file   Social History Narrative    Past Surgical History  Procedure Laterality Date  . Hernia repair      2005    Family History  Problem Relation Age of Onset  . Diabetes Mother   . Cancer Father     prostate  . Heart disease Father 64    MI  . Hypertension Father   . Diabetes Father   . Depression Father   . Heart disease Maternal Grandfather   . Heart disease Paternal Grandfather 74    Died MI    No Known Allergies  Current Outpatient Prescriptions on File Prior to Visit  Medication Sig Dispense Refill  . esomeprazole (NEXIUM) 40 MG capsule Take 1 capsule (40 mg total) by mouth daily. 30 capsule 6  . sertraline (ZOLOFT) 100 MG tablet Take 100 mg by mouth daily.    . valACYclovir (VALTREX) 500 MG tablet TAKE 1 TABLET BY MOUTH EVERY DAY 30 tablet 5  . ziprasidone (GEODON) 60 MG capsule Take 60 mg by mouth daily.     No current facility-administered medications on file prior to visit.       Objective:   Physical Exam  Constitutional: He is oriented to person, place, and time. He appears well-developed  and well-nourished. No distress.  BP 110/72 mmHg  Pulse 66  Temp(Src) 98.2 F (36.8 C) (Oral)  Resp 14  Ht 5\' 7"  (1.702 m)  Wt 235 lb (106.595 kg)  BMI 36.80 kg/m2  SpO2 97%  Poor hygeine  HENT:  Head: Normocephalic and atraumatic.  Right Ear: External ear normal.  Left Ear: External ear normal.  Nose: Nose normal.  Mouth/Throat: Oropharynx is clear and moist. No oropharyngeal exudate.  Eyes: Conjunctivae and EOM are normal. Pupils are equal, round, and reactive to light. Right eye exhibits no discharge. Left eye exhibits no discharge. No scleral icterus.  Glasses  Neck: Normal range of motion. Neck supple. No thyromegaly present.  Cardiovascular: Normal rate, regular rhythm, normal heart sounds and intact distal pulses.  Exam reveals no gallop and no friction rub.   No  murmur heard. Pulmonary/Chest: Effort normal and breath sounds normal. No respiratory distress. He has no wheezes. He has no rales. He exhibits no tenderness.  Abdominal: Soft. Bowel sounds are normal. He exhibits no distension and no mass. There is no tenderness. There is no rebound and no guarding.  Obese  Genitourinary:  Deferred due to pt making inappropriate sexual comments today  Musculoskeletal: Normal range of motion. He exhibits no edema or tenderness.  Lymphadenopathy:    He has no cervical adenopathy.  Neurological: He is alert and oriented to person, place, and time. He displays normal reflexes. No cranial nerve deficit. He exhibits normal muscle tone. Coordination normal.  Skin: Skin is warm and dry. No rash noted. He is not diaphoretic.  Psychiatric: He has a normal mood and affect. His speech is normal. Judgment and thought content normal. Cognition and memory are normal.  Patient displays inappropriate behavior with comments towards myself in a flirtatious nature.        Assessment & Plan:

## 2015-01-27 NOTE — Patient Instructions (Signed)
Please come back for your fasting labs (nothing to eat or drink for 8 hours except water and black coffee).   See you next year!

## 2015-01-27 NOTE — Progress Notes (Signed)
Pre visit review using our clinic review tool, if applicable. No additional management support is needed unless otherwise documented below in the visit note. 

## 2015-02-03 ENCOUNTER — Other Ambulatory Visit: Payer: Self-pay | Admitting: Nurse Practitioner

## 2015-02-03 ENCOUNTER — Other Ambulatory Visit: Payer: BLUE CROSS/BLUE SHIELD

## 2015-02-03 DIAGNOSIS — Z01419 Encounter for gynecological examination (general) (routine) without abnormal findings: Secondary | ICD-10-CM

## 2015-02-04 LAB — CBC WITH DIFFERENTIAL/PLATELET
Basophils Absolute: 0 10*3/uL (ref 0.0–0.1)
Basophils Relative: 0 % (ref 0–1)
EOS ABS: 0.1 10*3/uL (ref 0.0–0.7)
Eosinophils Relative: 2 % (ref 0–5)
HCT: 41.7 % (ref 39.0–52.0)
Hemoglobin: 14.2 g/dL (ref 13.0–17.0)
LYMPHS PCT: 22 % (ref 12–46)
Lymphs Abs: 1.6 10*3/uL (ref 0.7–4.0)
MCH: 30.1 pg (ref 26.0–34.0)
MCHC: 34.1 g/dL (ref 30.0–36.0)
MCV: 88.5 fL (ref 78.0–100.0)
MPV: 10.1 fL (ref 8.6–12.4)
Monocytes Absolute: 0.6 10*3/uL (ref 0.1–1.0)
Monocytes Relative: 8 % (ref 3–12)
Neutro Abs: 4.8 10*3/uL (ref 1.7–7.7)
Neutrophils Relative %: 68 % (ref 43–77)
PLATELETS: 178 10*3/uL (ref 150–400)
RBC: 4.71 MIL/uL (ref 4.22–5.81)
RDW: 13.3 % (ref 11.5–15.5)
WBC: 7.1 10*3/uL (ref 4.0–10.5)

## 2015-02-04 LAB — LIPID PANEL
CHOL/HDL RATIO: 3.7 ratio
CHOLESTEROL: 209 mg/dL — AB (ref 0–200)
HDL: 56 mg/dL (ref >=40)
LDL CALC: 131 mg/dL — AB (ref 0–99)
TRIGLYCERIDES: 111 mg/dL (ref <150)
VLDL: 22 mg/dL (ref 0–40)

## 2015-02-04 LAB — HEMOGLOBIN A1C
HEMOGLOBIN A1C: 5.7 % — AB (ref <5.7)
MEAN PLASMA GLUCOSE: 117 mg/dL — AB (ref <117)

## 2015-02-04 LAB — COMPREHENSIVE METABOLIC PANEL
ALK PHOS: 82 U/L (ref 39–117)
ALT: 32 U/L (ref 0–53)
AST: 32 U/L (ref 0–37)
Albumin: 4.5 g/dL (ref 3.5–5.2)
BILIRUBIN TOTAL: 0.5 mg/dL (ref 0.2–1.2)
BUN: 9 mg/dL (ref 6–23)
CO2: 26 meq/L (ref 19–32)
Calcium: 9.4 mg/dL (ref 8.4–10.5)
Chloride: 102 mEq/L (ref 96–112)
Creat: 0.67 mg/dL (ref 0.50–1.35)
Glucose, Bld: 87 mg/dL (ref 70–99)
Potassium: 3.9 mEq/L (ref 3.5–5.3)
Sodium: 136 mEq/L (ref 135–145)
TOTAL PROTEIN: 6.8 g/dL (ref 6.0–8.3)

## 2015-02-04 LAB — TSH: TSH: 2.208 u[IU]/mL (ref 0.350–4.500)

## 2015-02-12 DIAGNOSIS — F319 Bipolar disorder, unspecified: Secondary | ICD-10-CM | POA: Insufficient documentation

## 2015-02-12 DIAGNOSIS — Z0000 Encounter for general adult medical examination without abnormal findings: Secondary | ICD-10-CM | POA: Insufficient documentation

## 2015-02-12 NOTE — Assessment & Plan Note (Signed)
Discussed acute and chronic issues. Reviewed health maintenance measures, PFSHx, and immunizations. Obtain routine labs TSH, Lipid panel, CBC w/ diff, A1c, and CMET.   

## 2015-02-12 NOTE — Assessment & Plan Note (Signed)
Still dipping. Uses Nexium to help with GERD

## 2015-02-12 NOTE — Assessment & Plan Note (Addendum)
Patient has psychiatry, on Geodon currently. Pt is inapropriate with comments today. Maintained boundries

## 2015-04-03 ENCOUNTER — Telehealth: Payer: Self-pay

## 2015-04-03 NOTE — Telephone Encounter (Signed)
[employer] customer service and spoke to [place] to get prior-authorization for patient's Esomeprazole 40 MG one capsule twice daily. They wanted to know if patient was taking medication twice daily and I stated that he was. We received approval for one year (04/03/2015-04/02/2016). Authorization number is: [id number].  I will fax his approval form to his pharmacy [phone] [facility] [place], [place].

## 2015-05-08 ENCOUNTER — Other Ambulatory Visit: Payer: Self-pay

## 2015-05-08 MED ORDER — ESOMEPRAZOLE MAGNESIUM 40 MG PO CPDR: 40.0000 mg | DELAYED_RELEASE_CAPSULE | Freq: Two times a day (BID) | ORAL | Status: DC

## 2015-05-08 NOTE — Telephone Encounter (Signed)
Received refill request for patient. Rx refill sent to pharmacy at this time.

## 2015-11-22 ENCOUNTER — Other Ambulatory Visit: Payer: Self-pay | Admitting: Gastroenterology

## 2015-11-30 ENCOUNTER — Other Ambulatory Visit: Payer: Self-pay | Admitting: Gastroenterology

## 2015-12-01 ENCOUNTER — Other Ambulatory Visit: Payer: Self-pay

## 2015-12-01 DIAGNOSIS — K219 Gastro-esophageal reflux disease without esophagitis: Secondary | ICD-10-CM

## 2015-12-01 MED ORDER — ESOMEPRAZOLE MAGNESIUM 40 MG PO CPDR: 40.0000 mg | DELAYED_RELEASE_CAPSULE | Freq: Two times a day (BID) | ORAL | Status: DC

## 2016-01-08 ENCOUNTER — Encounter: Payer: BLUE CROSS/BLUE SHIELD | Admitting: Family Medicine

## 2016-01-09 ENCOUNTER — Encounter: Payer: Self-pay | Admitting: Family Medicine

## 2016-01-09 ENCOUNTER — Ambulatory Visit (INDEPENDENT_AMBULATORY_CARE_PROVIDER_SITE_OTHER): Payer: Managed Care, Other (non HMO) | Admitting: Family Medicine

## 2016-01-09 VITALS — BP 106/84 | HR 91 | Temp 98.4°F | Ht 67.75 in | Wt 238.0 lb

## 2016-01-09 DIAGNOSIS — Z79899 Other long term (current) drug therapy: Secondary | ICD-10-CM

## 2016-01-09 DIAGNOSIS — Z0000 Encounter for general adult medical examination without abnormal findings: Secondary | ICD-10-CM

## 2016-01-09 DIAGNOSIS — G4761 Periodic limb movement disorder: Secondary | ICD-10-CM

## 2016-01-09 MED ORDER — GABAPENTIN 300 MG PO CAPS: ORAL_CAPSULE | ORAL | Status: DC

## 2016-01-09 MED ORDER — VALACYCLOVIR HCL 500 MG PO TABS: 500.0000 mg | ORAL_TABLET | Freq: Every day | ORAL | Status: DC

## 2016-01-09 NOTE — Progress Notes (Signed)
Pre visit review using our clinic review tool, if applicable. No additional management support is needed unless otherwise documented below in the visit note. 

## 2016-01-09 NOTE — Assessment & Plan Note (Signed)
New problem. History consistent with this. Likely worsened by saphris. Treating with Gabapentin.

## 2016-01-09 NOTE — Assessment & Plan Note (Signed)
Annual exam done today. Preventative health care up to date. Screening labs today: CBC, CMP, A1C, Lipid panel (due to long term use of antipsychotic for bipolar disorder).

## 2016-01-09 NOTE — Progress Notes (Signed)
Subjective:  Patient ID: Nathaniel Ball, male    DOB: 09/15/1980  Age: 35 y.o. MRN: [id number]  CC: Annual physical exam.  HPI Nathaniel Ball is a 35 y.o. male presents to the clinic today for an annual physical exam. Additionally, he reports restless leg.  Preventative Healthcare  Colonoscopy: N/A.  Immunizations  Tetanus - Up to date.  Pneumococcal - N/A.  Flu - Not indicated at this time.  Zoster - N/A.  Prostate cancer screening: N/A.  Hepatitis C screening - N/A.  Labs: In need of screening labs today in the setting of chronic antipsychotic use.  Alcohol use: See below.  Smoking/tobacco use: Nonsmoker. Uses dip.  Restless leg  Patient reports recent worsening of "restless leg".  He states that his legs shake severely during the night.  This is very troublesome for his wife and interferes with her sleep.  It does not appear to interfere much with his sleep.   He states that this is worse since starting Saphris.   No interventions/medications tried.  PMH, Surgical Hx, Family Hx, Social History reviewed and updated as below.  Past Medical History  Diagnosis Date  . Depression   . Chicken pox   . GERD (gastroesophageal reflux disease)   . Genital warts     2007  . Bipolar disorder [facility])    Past Surgical History  Procedure Laterality Date  . Hernia repair      2005   Family History  Problem Relation Age of Onset  . Diabetes Mother   . Cancer Father     prostate  . Heart disease Father 83    MI  . Hypertension Father   . Diabetes Father   . Depression Father   . Heart disease Maternal Grandfather   . Heart disease Paternal Grandfather 27    Died MI   Social History  Substance Use Topics  . Smoking status: Former Smoker    Types: Cigarettes  . Smokeless tobacco: Current User    Types: Chew  . Alcohol Use: Yes     Comment: social   Review of Systems  Gastrointestinal:       GERD.  Neurological:       Restless leg.  All other  systems reviewed and are negative.  Objective:   Today's Vitals: BP 106/84 mmHg  Pulse 91  Temp(Src) 98.4 F (36.9 C) (Oral)  Ht 5' 7.75" (1.721 m)  Wt 238 lb (107.956 kg)  BMI 36.45 kg/m2  SpO2 93%  Physical Exam  Constitutional: He is oriented to person, place, and time. He appears well-developed. No distress.  HENT:  Head: Normocephalic and atraumatic.  Mouth/Throat: Oropharynx is clear and moist.  Normal TM's bilaterally.  Eyes: Conjunctivae are normal. No scleral icterus.  Neck: Neck supple.  Cardiovascular: Normal rate and regular rhythm.   No murmur heard. Pulmonary/Chest: Effort normal and breath sounds normal.  Abdominal: Soft. He exhibits no distension. There is no tenderness. There is no rebound and no guarding.  Musculoskeletal: Normal range of motion. He exhibits no edema.  Neurological: He is alert and oriented to person, place, and time.  No focal deficits.  Skin: Skin is warm and dry. No rash noted.  Psychiatric: He has a normal mood and affect. His behavior is normal. Thought content normal.  Vitals reviewed.  Assessment & Plan:   Problem List Items Addressed This Visit    Routine general medical examination at a health care facility - Primary    Annual exam  done today. Preventative health care up to date. Screening labs today: CBC, CMP, A1C, Lipid panel (due to long term use of antipsychotic for bipolar disorder).      Periodic limb movement disorder    New problem. History consistent with this. Likely worsened by saphris. Treating with Gabapentin.        Other Visit Diagnoses    Long term use of drug        Relevant Orders    CBC    Comp Met (CMET)    Lipid Profile    HgB A1c       Outpatient Encounter Prescriptions as of 01/09/2016  Medication Sig  . esomeprazole (NEXIUM) 40 MG capsule Take 1 capsule (40 mg total) by mouth 2 (two) times daily before a meal.  . sertraline (ZOLOFT) 100 MG tablet Take 100 mg by mouth daily.  . valACYclovir  (VALTREX) 500 MG tablet Take 1 tablet (500 mg total) by mouth daily.  . [DISCONTINUED] valACYclovir (VALTREX) 500 MG tablet TAKE 1 TABLET BY MOUTH EVERY DAY  . [DISCONTINUED] valACYclovir (VALTREX) 500 MG tablet Take 1 tablet (500 mg total) by mouth daily.  . [DISCONTINUED] ziprasidone (GEODON) 60 MG capsule Take 60 mg by mouth daily.  Marland Kitchen gabapentin (NEURONTIN) 300 MG capsule 1 tablet 2 hours before bed.  Marland Kitchen SAPHRIS 10 MG SUBL    No facility-administered encounter medications on file as of 01/09/2016.    Follow-up: Annually or sooner if needed.   [place]

## 2016-01-09 NOTE — Patient Instructions (Signed)
Take the gabapentin as prescribed.  Follow up annually or sooner if needed.  Take care  Dr. Adriana Simasook  Health Maintenance, Male A healthy lifestyle and preventative care can promote health and wellness.  Maintain regular health, dental, and eye exams.  Eat a healthy diet. Foods like vegetables, fruits, whole grains, low-fat dairy products, and lean protein foods contain the nutrients you need and are low in calories. Decrease your intake of foods high in solid fats, added sugars, and salt. Get information about a proper diet from your health care provider, if necessary.  Regular physical exercise is one of the most important things you can do for your health. Most adults should get at least 150 minutes of moderate-intensity exercise (any activity that increases your heart rate and causes you to sweat) each week. In addition, most adults need muscle-strengthening exercises on 2 or more days a week.   Maintain a healthy weight. The body mass index (BMI) is a screening tool to identify possible weight problems. It provides an estimate of body fat based on height and weight. Your health care provider can find your BMI and can help you achieve or maintain a healthy weight. For males 20 years and older:  A BMI below 18.5 is considered underweight.  A BMI of 18.5 to 24.9 is normal.  A BMI of 25 to 29.9 is considered overweight.  A BMI of 30 and above is considered obese.  Maintain normal blood lipids and cholesterol by exercising and minimizing your intake of saturated fat. Eat a balanced diet with plenty of fruits and vegetables. Blood tests for lipids and cholesterol should begin at age 36 and be repeated every 5 years. If your lipid or cholesterol levels are high, you are over age 36, or you are at high risk for heart disease, you may need your cholesterol levels checked more frequently.Ongoing high lipid and cholesterol levels should be treated with medicines if diet and exercise are not  working.  If you smoke, find out from your health care provider how to quit. If you do not use tobacco, do not start.  Lung cancer screening is recommended for adults aged 55-80 years who are at high risk for developing lung cancer because of a history of smoking. A yearly low-dose CT scan of the lungs is recommended for people who have at least a 30-pack-year history of smoking and are current smokers or have quit within the past 15 years. A pack year of smoking is smoking an average of 1 pack of cigarettes a day for 1 year (for example, a 30-pack-year history of smoking could mean smoking 1 pack a day for 30 years or 2 packs a day for 15 years). Yearly screening should continue until the smoker has stopped smoking for at least 15 years. Yearly screening should be stopped for people who develop a health problem that would prevent them from having lung cancer treatment.  If you choose to drink alcohol, do not have more than 2 drinks per day. One drink is considered to be 12 oz (360 mL) of beer, 5 oz (150 mL) of wine, or 1.5 oz (45 mL) of liquor.  Avoid the use of street drugs. Do not share needles with anyone. Ask for help if you need support or instructions about stopping the use of drugs.  High blood pressure causes heart disease and increases the risk of stroke. High blood pressure is more likely to develop in:  People who have blood pressure in the end of  the normal range (100-139/85-89 mm Hg).  People who are overweight or obese.  People who are African American.  If you are 38-41 years of age, have your blood pressure checked every 3-5 years. If you are 25 years of age or older, have your blood pressure checked every year. You should have your blood pressure measured twice--once when you are at a hospital or clinic, and once when you are not at a hospital or clinic. Record the average of the two measurements. To check your blood pressure when you are not at a hospital or clinic, you can  use:  An automated blood pressure machine at a pharmacy.  A home blood pressure monitor.  If you are 36-41 years old, ask your health care provider if you should take aspirin to prevent heart disease.  Diabetes screening involves taking a blood sample to check your fasting blood sugar level. This should be done once every 3 years after age 74 if you are at a normal weight and without risk factors for diabetes. Testing should be considered at a younger age or be carried out more frequently if you are overweight and have at least 1 risk factor for diabetes.  Colorectal cancer can be detected and often prevented. Most routine colorectal cancer screening begins at the age of 49 and continues through age 2. However, your health care provider may recommend screening at an earlier age if you have risk factors for colon cancer. On a yearly basis, your health care provider may provide home test kits to check for hidden blood in the stool. A small camera at the end of a tube may be used to directly examine the colon (sigmoidoscopy or colonoscopy) to detect the earliest forms of colorectal cancer. Talk to your health care provider about this at age 70 when routine screening begins. A direct exam of the colon should be repeated every 5-10 years through age 25, unless early forms of precancerous polyps or small growths are found.  People who are at an increased risk for hepatitis B should be screened for this virus. You are considered at high risk for hepatitis B if:  You were born in a country where hepatitis B occurs often. Talk with your health care provider about which countries are considered high risk.  Your parents were born in a high-risk country and you have not received a shot to protect against hepatitis B (hepatitis B vaccine).  You have HIV or AIDS.  You use needles to inject street drugs.  You live with, or have sex with, someone who has hepatitis B.  You are a man who has sex with other  men (MSM).  You get hemodialysis treatment.  You take certain medicines for conditions like cancer, organ transplantation, and autoimmune conditions.  Hepatitis C blood testing is recommended for all people born from [birth year] through 1965 and any individual with known risk factors for hepatitis C.  Healthy men should no longer receive prostate-specific antigen (PSA) blood tests as part of routine cancer screening. Talk to your health care provider about prostate cancer screening.  Testicular cancer screening is not recommended for adolescents or adult males who have no symptoms. Screening includes self-exam, a health care provider exam, and other screening tests. Consult with your health care provider about any symptoms you have or any concerns you have about testicular cancer.  Practice safe sex. Use condoms and avoid high-risk sexual practices to reduce the spread of sexually transmitted infections (STIs).  You should be screened  for STIs, including gonorrhea and chlamydia if:  You are sexually active and are younger than 24 years.  You are older than 24 years, and your health care provider tells you that you are at risk for this type of infection.  Your sexual activity has changed since you were last screened, and you are at an increased risk for chlamydia or gonorrhea. Ask your health care provider if you are at risk.  If you are at risk of being infected with HIV, it is recommended that you take a prescription medicine daily to prevent HIV infection. This is called pre-exposure prophylaxis (PrEP). You are considered at risk if:  You are a man who has sex with other men (MSM).  You are a heterosexual man who is sexually active with multiple partners.  You take drugs by injection.  You are sexually active with a partner who has HIV.  Talk with your health care provider about whether you are at high risk of being infected with HIV. If you choose to begin PrEP, you should first be tested  for HIV. You should then be tested every 3 months for as long as you are taking PrEP.  Use sunscreen. Apply sunscreen liberally and repeatedly throughout the day. You should seek shade when your shadow is shorter than you. Protect yourself by wearing long sleeves, pants, a wide-brimmed hat, and sunglasses year round whenever you are outdoors.  Tell your health care provider of new moles or changes in moles, especially if there is a change in shape or color. Also, tell your health care provider if a mole is larger than the size of a pencil eraser.  A one-time screening for abdominal aortic aneurysm (AAA) and surgical repair of large AAAs by ultrasound is recommended for men aged 59-75 years who are current or former smokers.  Stay current with your vaccines (immunizations).   This information is not intended to replace advice given to you by your health care provider. Make sure you discuss any questions you have with your health care provider.   Document Released: 02/08/2008 Document Revised: 09/02/2014 Document Reviewed: 01/07/2011 Elsevier Interactive Patient Education [employer].

## 2016-01-10 LAB — COMPREHENSIVE METABOLIC PANEL
ALK PHOS: 77 U/L (ref 39–117)
ALT: 23 U/L (ref 0–53)
AST: 27 U/L (ref 0–37)
Albumin: 4.3 g/dL (ref 3.5–5.2)
BUN: 13 mg/dL (ref 6–23)
CALCIUM: 9.3 mg/dL (ref 8.4–10.5)
CHLORIDE: 105 meq/L (ref 96–112)
CO2: 26 meq/L (ref 19–32)
CREATININE: 0.72 mg/dL (ref 0.40–1.50)
GFR: 131.75 mL/min (ref >60.00)
Glucose, Bld: 90 mg/dL (ref 70–99)
Potassium: 4 mEq/L (ref 3.5–5.1)
SODIUM: 139 meq/L (ref 135–145)
Total Bilirubin: 0.2 mg/dL (ref 0.2–1.2)
Total Protein: 7.2 g/dL (ref 6.0–8.3)

## 2016-01-10 LAB — LIPID PANEL
CHOLESTEROL: 191 mg/dL (ref 0–200)
HDL: 36.2 mg/dL — ABNORMAL LOW (ref >39.00)
LDL CALC: 126 mg/dL — AB (ref 0–99)
NonHDL: 155.17
TRIGLYCERIDES: 147 mg/dL (ref 0.0–149.0)
Total CHOL/HDL Ratio: 5
VLDL: 29.4 mg/dL (ref 0.0–40.0)

## 2016-01-10 LAB — CBC
HCT: 40.5 % (ref 39.0–52.0)
Hemoglobin: 13.7 g/dL (ref 13.0–17.0)
MCHC: 33.9 g/dL (ref 30.0–36.0)
MCV: 87 fl (ref 78.0–100.0)
Platelets: 204 10*3/uL (ref 150.0–400.0)
RBC: 4.65 Mil/uL (ref 4.22–5.81)
RDW: 13.1 % (ref 11.5–15.5)
WBC: 6.3 10*3/uL (ref 4.0–10.5)

## 2016-01-10 LAB — HEMOGLOBIN A1C: HEMOGLOBIN A1C: 5.9 % (ref 4.6–6.5)

## 2016-04-08 ENCOUNTER — Other Ambulatory Visit: Payer: Self-pay | Admitting: Family Medicine

## 2016-09-17 ENCOUNTER — Other Ambulatory Visit: Payer: Self-pay | Admitting: Family Medicine

## 2016-09-17 NOTE — Telephone Encounter (Signed)
Refilled 01/09/16. Pt last seen 01/09/16. Please advise?

## 2016-09-23 ENCOUNTER — Other Ambulatory Visit: Payer: Self-pay | Admitting: Family

## 2016-09-23 ENCOUNTER — Ambulatory Visit (INDEPENDENT_AMBULATORY_CARE_PROVIDER_SITE_OTHER): Payer: Managed Care, Other (non HMO)

## 2016-09-23 ENCOUNTER — Telehealth: Payer: Self-pay | Admitting: Family Medicine

## 2016-09-23 ENCOUNTER — Ambulatory Visit (INDEPENDENT_AMBULATORY_CARE_PROVIDER_SITE_OTHER): Payer: Managed Care, Other (non HMO) | Admitting: Family

## 2016-09-23 ENCOUNTER — Ambulatory Visit: Payer: Managed Care, Other (non HMO) | Admitting: Family Medicine

## 2016-09-23 ENCOUNTER — Encounter: Payer: Self-pay | Admitting: Family

## 2016-09-23 VITALS — BP 126/62 | HR 79 | Temp 98.0°F | Ht 67.75 in | Wt 239.2 lb

## 2016-09-23 DIAGNOSIS — J189 Pneumonia, unspecified organism: Secondary | ICD-10-CM

## 2016-09-23 DIAGNOSIS — J40 Bronchitis, not specified as acute or chronic: Secondary | ICD-10-CM

## 2016-09-23 LAB — POCT RAPID STREP A (OFFICE): RAPID STREP A SCREEN: NEGATIVE

## 2016-09-23 MED ORDER — CEFDINIR 300 MG PO CAPS: 300.0000 mg | ORAL_CAPSULE | Freq: Two times a day (BID) | ORAL | 0 refills | Status: DC

## 2016-09-23 MED ORDER — BENZONATATE 100 MG PO CAPS: 100.0000 mg | ORAL_CAPSULE | Freq: Three times a day (TID) | ORAL | 1 refills | Status: DC | PRN

## 2016-09-23 MED ORDER — GUAIFENESIN ER 600 MG PO TB12: 1200.0000 mg | ORAL_TABLET | Freq: Two times a day (BID) | ORAL | 3 refills | Status: DC

## 2016-09-23 NOTE — Telephone Encounter (Signed)
Patient was informed of results.  Patient understood and no questions, comments, or concerns at this time.  

## 2016-09-23 NOTE — Patient Instructions (Addendum)
Mucinex  CXR  Please take cough medication at night only as needed. As we discussed, I do not recommend dosing throughout the day as coughing is a protective mechanism . It also helps to break up thick mucous.  Do not take cough suppressants with alcohol as can lead to trouble breathing. Advise caution if taking cough suppressant and operating machinery ( i.e driving a car) as you may feel very tired.   Increase intake of clear fluids. Congestion is best treated by hydration, when mucus is wetter, it is thinner, less sticky, and easier to expel from the body, either through coughing up drainage, or by blowing your nose.   Get plenty of rest.   Use saline nasal drops and blow your nose frequently. Run a humidifier at night and elevate the head of the bed. Vicks Vapor rub will help with congestion and cough. Steam showers and sinus massage for congestion.   Use Acetaminophen or Ibuprofen as needed for fever or pain. Avoid second hand smoke. Even the smallest exposure will worsen symptoms.   Over the counter medications you can try include Delsym for cough, a decongestant for congestion, and Mucinex or Robitussin as an expectorant. Be sure to just get the plain Mucinex or Robitussin that just has one medication (Guaifenesen). We don't recommend the combination products. Note, be sure to drink two glasses of water with each dose of Mucinex as the medication will not work well without adequate hydration.   You can also try a teaspoon of honey to see if this will help reduce cough. Throat lozenges can sometimes be beneficial as well.    This illness will typically last 7 - 10 days.   Please follow up with our clinic if you develop a fever greater than 101 F, symptoms worsen, or do not resolve in the next week.

## 2016-09-23 NOTE — Telephone Encounter (Signed)
Pt wife called looking for chest xray results. Thank you!  Call pt @ [phone]

## 2016-09-23 NOTE — Progress Notes (Signed)
Pre visit review using our clinic review tool, if applicable. No additional management support is needed unless otherwise documented below in the visit note. 

## 2016-09-23 NOTE — Progress Notes (Signed)
Subjective:    Patient ID: Nathaniel Ball, male    DOB: Jul 21, 1981, 36 y.o.   MRN: [id number]  CC: Nathaniel Ball is a 36 y.o. male who presents today for an acute visit.    HPI: CC: cough, congestion x 1 one month, unchanged. Endorses tactile warmth ( now resolved), sore throat. Tried tyelonol, dayquil with little relief. No fever, chills.   2 young children  States had similar symptoms 2 months ago, which resolved.   No lung disease. Former smoker     HISTORY:  Past Medical History:  Diagnosis Date  . Bipolar disorder (HCC)   . Chicken pox   . Depression   . Genital warts    2007  . GERD (gastroesophageal reflux disease)    Past Surgical History:  Procedure Laterality Date  . HERNIA REPAIR     2005   Family History  Problem Relation Age of Onset  . Diabetes Mother   . Cancer Father     prostate  . Heart disease Father 71    MI  . Hypertension Father   . Diabetes Father   . Depression Father   . Heart disease Maternal Grandfather   . Heart disease Paternal Grandfather 62    Died MI    Allergies: Patient has no known allergies. Current Outpatient Prescriptions on File Prior to Visit  Medication Sig Dispense Refill  . esomeprazole (NEXIUM) 40 MG capsule Take 1 capsule (40 mg total) by mouth 2 (two) times daily before a meal. 60 capsule 11  . sertraline (ZOLOFT) 100 MG tablet Take 100 mg by mouth daily.    . valACYclovir (VALTREX) 500 MG tablet take 1 tablet by mouth once daily 90 tablet 0   No current facility-administered medications on file prior to visit.     Social History  Substance Use Topics  . Smoking status: Former Smoker    Types: Cigarettes  . Smokeless tobacco: Current User    Types: Chew  . Alcohol use Yes     Comment: social    Review of Systems  Constitutional: Negative for chills and fever.  HENT: Positive for congestion and sore throat. Negative for ear pain and sinus pressure.   Respiratory: Positive for cough. Negative for  shortness of breath and wheezing.   Cardiovascular: Negative for chest pain and palpitations.  Gastrointestinal: Negative for nausea and vomiting.      Objective:    BP 126/62   Pulse 79   Temp 98 F (36.7 C) (Oral)   Ht 5' 7.75" (1.721 m)   Wt 239 lb 3.2 oz (108.5 kg)   SpO2 98%   BMI 36.64 kg/m    Physical Exam  Constitutional: Vital signs are normal. He appears well-developed and well-nourished.  HENT:  Head: Normocephalic and atraumatic.  Right Ear: Hearing, tympanic membrane, external ear and ear canal normal. No drainage, swelling or tenderness. Tympanic membrane is not injected, not erythematous and not bulging. No middle ear effusion. No decreased hearing is noted.  Left Ear: Hearing, tympanic membrane, external ear and ear canal normal. No drainage, swelling or tenderness. Tympanic membrane is not injected, not erythematous and not bulging.  No middle ear effusion. No decreased hearing is noted.  Nose: Nose normal. Right sinus exhibits no maxillary sinus tenderness and no frontal sinus tenderness. Left sinus exhibits no maxillary sinus tenderness and no frontal sinus tenderness.  Mouth/Throat: Uvula is midline and mucous membranes are normal. Posterior oropharyngeal erythema present. No oropharyngeal exudate, posterior oropharyngeal  edema or tonsillar abscesses.  Eyes: Conjunctivae are normal.  Cardiovascular: Regular rhythm and normal heart sounds.   Pulmonary/Chest: Effort normal and breath sounds normal. No respiratory distress. He has no wheezes. He has no rhonchi. He has no rales.  Lymphadenopathy:       Head (right side): No submental, no submandibular, no tonsillar, no preauricular, no posterior auricular and no occipital adenopathy present.       Head (left side): No submental, no submandibular, no tonsillar, no preauricular, no posterior auricular and no occipital adenopathy present.    He has no cervical adenopathy.  Neurological: He is alert.  Skin: Skin is warm  and dry.  Psychiatric: He has a normal mood and affect. His speech is normal and behavior is normal.  Vitals reviewed.      Assessment & Plan:  1. Bronchitis Neg strep. Patient and I agreed on conservative therapy first prior to antibiotics. Pending CXR due to duration of symptoms. Return precautions given.  - POCT rapid strep A - benzonatate (TESSALON PERLES) 100 MG capsule; Take 1 capsule (100 mg total) by mouth 3 (three) times daily as needed for cough.  Dispense: 30 capsule; Refill: 1 - guaiFENesin (MUCINEX) 600 MG 12 hr tablet; Take 2 tablets (1,200 mg total) by mouth 2 (two) times daily.  Dispense: 28 tablet; Refill: 3 - DG Chest 2 View     I have discontinued Mr. Nathaniel Ball's SAPHRIS and gabapentin. I am also having him maintain his sertraline, esomeprazole, valACYclovir, and QUEtiapine.   Meds ordered this encounter  Medications  . QUEtiapine (SEROQUEL) 50 MG tablet    Return precautions given.   Risks, benefits, and alternatives of the medications and treatment plan prescribed today were discussed, and patient expressed understanding.   Education regarding symptom management and diagnosis given to patient on AVS.  Continue to follow with Nathaniel SamsJayce G Cook, DO for routine health maintenance.   Nathaniel Ball and I agreed with plan.   Nathaniel PlowmanMargaret Bilal Manzer, FNP

## 2016-10-22 ENCOUNTER — Ambulatory Visit (INDEPENDENT_AMBULATORY_CARE_PROVIDER_SITE_OTHER): Payer: Managed Care, Other (non HMO) | Admitting: Family Medicine

## 2016-10-22 ENCOUNTER — Encounter: Payer: Self-pay | Admitting: Family Medicine

## 2016-10-22 ENCOUNTER — Ambulatory Visit (INDEPENDENT_AMBULATORY_CARE_PROVIDER_SITE_OTHER): Payer: Managed Care, Other (non HMO)

## 2016-10-22 VITALS — BP 117/80 | HR 97 | Temp 98.7°F | Wt 241.2 lb

## 2016-10-22 DIAGNOSIS — J189 Pneumonia, unspecified organism: Secondary | ICD-10-CM | POA: Diagnosis not present

## 2016-10-22 NOTE — Patient Instructions (Signed)
Continue your medications.  We will call with your chest x-ray results.  Take care  Dr. Adriana Simasook

## 2016-10-22 NOTE — Assessment & Plan Note (Signed)
Improved clinically. Chest xray obtained and revealed diffuse mild interstitial prominence without significant change.  I reviewed the xray and feel like there is improvement.  Given the fact that he is feeling well, will not pursue any further. Patient is to return if he develops any other symptoms.

## 2016-10-22 NOTE — Progress Notes (Signed)
Pre visit review using our clinic review tool, if applicable. No additional management support is needed unless otherwise documented below in the visit note. 

## 2016-10-22 NOTE — Progress Notes (Signed)
Subjective:  Patient ID: Nathaniel Ball, male    DOB: 1980/11/21  Age: 36 y.o. MRN: [id number]  CC: Follow up recent CAP  HPI:  36 year old male presents for follow-up regarding recent community acquired pneumonia.  Patient was recently seen on 1/29. Chest x-ray was obtained and revealed findings concerning for pneumonia. He was treated with Omnicef.  Patient resents today for follow-up. He states that he is doing fairly well. Essentially no more cough. He does have a runny nose. No fever. No chills. He does note some fatigue. No other symptoms or complaints at this time. He did complete his antibiotic course.  Social Hx   Social History   Social History  . Marital status: Married    Spouse name: PhotographerAmber Broker  . Number of children: N/A  . Years of education: 6212   Occupational History  .  [employer]   Social History Main Topics  . Smoking status: Former Smoker    Types: Cigarettes  . Smokeless tobacco: Current User    Types: Chew  . Alcohol use Yes     Comment: social  . Drug use: No  . Sexual activity: Not Asked   Other Topics Concern  . 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 Negative for fever.  HENT: Positive for rhinorrhea.   Respiratory: Negative for cough.    Objective:  BP 117/80 (BP Location: Left Arm, Patient Position: Sitting, Cuff Size: Large)   Pulse 97   Temp 98.7 F (37.1 C) (Oral)   Wt 241 lb 3.2 oz (109.4 kg)   SpO2 97%   BMI 36.95 kg/m   BP/Weight 10/22/2016 09/23/2016 01/09/2016  Systolic BP 117 126 106  Diastolic BP 80 62 84  Wt. (Lbs) 241.2 239.2 238  BMI 36.95 36.64 36.45   Physical Exam  Constitutional: He is oriented to person, place, and time. He appears well-developed. No distress.  HENT:  Mouth/Throat: Oropharynx is clear and moist.  Cardiovascular: Normal rate and regular rhythm.     Pulmonary/Chest: Effort normal and breath sounds normal.  Neurological: He is alert and oriented to person, place, and time.  Psychiatric: He has a normal mood and affect.  Vitals reviewed.  Lab Results  Component Value Date   WBC 6.3 01/09/2016   HGB 13.7 01/09/2016   HCT 40.5 01/09/2016   PLT 204.0 01/09/2016   GLUCOSE 90 01/09/2016   CHOL 191 01/09/2016   TRIG 147.0 01/09/2016   HDL 36.20 (L) 01/09/2016   LDLDIRECT 105.0 10/12/2012   LDLCALC 126 (H) 01/09/2016   ALT 23 01/09/2016   AST 27 01/09/2016   NA 139 01/09/2016   K 4.0 01/09/2016   CL 105 01/09/2016   CREATININE 0.72 01/09/2016   BUN 13 01/09/2016   CO2 26 01/09/2016   TSH 2.208 02/03/2015   HGBA1C 5.9 01/09/2016   Assessment & Plan:   Problem List Items Addressed This Visit    Community acquired pneumonia - Primary    Improved clinically. Chest xray obtained and revealed diffuse mild interstitial prominence without significant change.  I reviewed the xray and feel like there is improvement.  Given the fact that he is feeling well, will not pursue any further. Patient is to return if he develops any other symptoms.      Relevant Orders  DG Chest 2 View (Completed)     Follow-up: PRN  Everlene Other DO [facility]

## 2016-12-25 ENCOUNTER — Other Ambulatory Visit: Payer: Self-pay | Admitting: Gastroenterology

## 2017-01-09 ENCOUNTER — Other Ambulatory Visit: Payer: Self-pay | Admitting: Family Medicine

## 2017-04-07 ENCOUNTER — Other Ambulatory Visit: Payer: Self-pay | Admitting: Family Medicine

## 2017-07-03 ENCOUNTER — Other Ambulatory Visit: Payer: Self-pay | Admitting: Family Medicine

## 2017-10-02 ENCOUNTER — Other Ambulatory Visit: Payer: Self-pay

## 2017-10-02 ENCOUNTER — Ambulatory Visit: Payer: Self-pay

## 2017-10-02 ENCOUNTER — Ambulatory Visit (INDEPENDENT_AMBULATORY_CARE_PROVIDER_SITE_OTHER): Payer: 59 | Admitting: Family Medicine

## 2017-10-02 ENCOUNTER — Encounter: Payer: Self-pay | Admitting: Family Medicine

## 2017-10-02 VITALS — BP 110/62 | HR 95 | Temp 98.8°F | Wt 247.6 lb

## 2017-10-02 DIAGNOSIS — L259 Unspecified contact dermatitis, unspecified cause: Secondary | ICD-10-CM

## 2017-10-02 MED ORDER — TRIAMCINOLONE ACETONIDE 0.5 % EX OINT
1.0000 | TOPICAL_OINTMENT | Freq: Two times a day (BID) | CUTANEOUS | 0 refills | Status: DC
Start: 2017-10-02 — End: 2022-12-02

## 2017-10-02 NOTE — Telephone Encounter (Signed)
Rash has been there for about 2 weeks and is "irritating".  Reason for Disposition . Looks like a boil, infected sore, deep ulcer or other infected rash  Answer Assessment - Initial Assessment Questions 1. APPEARANCE of RASH: "Describe the rash."      red 2. LOCATION: "Where is the rash located?"      Neck  3. NUMBER: "How many spots are there?"      Unsure 4. SIZE: "How big are the spots?" (Inches, centimeters or compare to size of a coin)      Right side  5. ONSET: "When did the rash start?"      Started 2 weeks ago  6. ITCHING: "Does the rash itch?" If so, ask: "How bad is the itch?"  (Scale 1-10; or mild, moderate, severe)     No 7. PAIN: "Does the rash hurt?" If so, ask: "How bad is the pain?"  (Scale 1-10; or mild, moderate, severe)     No 8. OTHER SYMPTOMS: "Do you have any other symptoms?" (e.g., fever)     No  9. PREGNANCY: "Is there any chance you are pregnant?" "When was your last menstrual period?"     No  Protocols used: RASH OR REDNESS - LOCALIZED-A-AH

## 2017-10-02 NOTE — Progress Notes (Signed)
  PCP: Tommie Samsook, Jayce G, DO  Subjective:  Nathaniel Ball is a 37 y.o. year old very pleasant male patient who presents with a Rash: Initial distribution: neck and hands Prior history of this rash: No Discomfort associated: Dryness, mild burning Associated symptoms:  Pruritus unless using lotion Change in rash:  None Denies:  Fever, chills, sweats, myalgias, difficulty swallowing, hoarseness, SOB, N/V, Abdominal pain, or tightening throat. No new exposures of soaps, lotions, laundry detergent, fabric softeners, foods, medications, herbal supplements, STDs, plants, animals, or insects. Exposure to adhesive and chemicals through work.  -started: 2 weeks ago, symptoms are not improving -previous treatments: Aquaphor, Vaseline, and cocoa butter have provided limited benefit. -sick contacts/travel/risks: denies flu exposure.  -Hx of: Bipolar disorder  Works at [employer] and works with chemicals and glues that he may have had exposure to prior to the rash.   ROS-denies fever, SOB, NVD,   Pertinent Past Medical History- Bipolar disorder   Medications- reviewed  Current Outpatient Medications  Medication Sig Dispense Refill  . esomeprazole (NEXIUM) 40 MG capsule take 1 capsule by mouth twice a day before meals 60 capsule 11  . QUEtiapine (SEROQUEL) 50 MG tablet     . sertraline (ZOLOFT) 100 MG tablet Take 100 mg by mouth daily.    . valACYclovir (VALTREX) 500 MG tablet TAKE 1 TABLET BY MOUTH ONCE DAILY 90 tablet 0   No current facility-administered medications for this visit.     Objective: BP 110/62   Pulse 95   Temp 98.8 F (37.1 C) (Oral)   Wt 247 lb 9.6 oz (112.3 kg)   SpO2 96%   BMI 37.93 kg/m  Gen: NAD, resting comfortably HEENT: Oropharynx is clear and moist CV: RRR no murmurs rubs or gallops Lungs: CTAB no crackles, wheeze, rhonchi Abdomen: soft/nontender/nondistended/normal bowel sounds. No rebound or guarding.  Ext: no edema Skin: warm, mild dryness and erythema  noted on right side of neck and on  4th and 5th digit on right hand. No vesicles or bullae or oozing present today. Neuro: grossly normal, moves all extremities  Assessment/Plan: 1. Contact dermatitis, unspecified contact dermatitis type, unspecified trigger History and exam are suggestive of contact dermatitis that may be caused by an irritant. Advised avoidance of adhesives and glue during treatment with topical steroid. Further advised patient to wear protective gloves and clothing to avoid exposure to irritants when working. Will trial avoidance of possible trigger, initiate topical steroid, and follow up with provider if symptoms do not improve. We discussed a referral to dermatology for patch testing if no improvement. Work note provided for short term avoidance of chemicals during treatment of symptoms.   - triamcinolone ointment (KENALOG) 0.5 %; Apply 1 application topically 2 (two) times daily.  Dispense: 30 g; Refill: 0  Finally, we reviewed reasons to return to care including if symptoms worsen or persist or new concerns arise- once again particularly rash that does not improve,  shortness of breath, N/V, or fever. Further advised patient to keep a diary of food choices and also use dye/scent free products.  Reviewed importance of calling 911immediately if symptoms of SOB, difficulty swallowing, or throat tightening.   Inez CatalinaJulia Ann Terrea Bruster, FNP

## 2017-10-02 NOTE — Patient Instructions (Signed)
Please continue moisturizing and use prescription cream for symptom relief. Follow up with your provider if symptoms do not improve with treatment, worsen, or new symptoms develop. Also, consider keeping a diary of any new symptoms and possible triggers.  Contact Dermatitis Dermatitis is redness, soreness, and swelling (inflammation) of the skin. Contact dermatitis is a reaction to certain substances that touch the skin. You either touched something that irritated your skin, or you have allergies to something you touched. Follow these instructions at home: Skin Care  Moisturize your skin as needed.  Apply cool compresses to the affected areas.  Try taking a bath with: ? Epsom salts. Follow the instructions on the package. You can get these at a pharmacy or grocery store. ? Baking soda. Pour a small amount into the bath as told by your doctor. ? Colloidal oatmeal. Follow the instructions on the package. You can get this at a pharmacy or grocery store.  Try applying baking soda paste to your skin. Stir water into baking soda until it looks like paste.  Do not scratch your skin.  Bathe less often.  Bathe in lukewarm water. Avoid using hot water. Medicines  Take or apply over-the-counter and prescription medicines only as told by your doctor.  If you were prescribed an antibiotic medicine, take or apply your antibiotic as told by your doctor. Do not stop taking the antibiotic even if your condition starts to get better. General instructions  Keep all follow-up visits as told by your doctor. This is important.  Avoid the substance that caused your reaction. If you do not know what caused it, keep a journal to try to track what caused it. Write down: ? What you eat. ? What cosmetic products you use. ? What you drink. ? What you wear in the affected area. This includes jewelry.  If you were given a bandage (dressing), take care of it as told by your doctor. This includes when to  change and remove it. Contact a doctor if:  You do not get better with treatment.  Your condition gets worse.  You have signs of infection such as: ? Swelling. ? Tenderness. ? Redness. ? Soreness. ? Warmth.  You have a fever.  You have new symptoms. Get help right away if:  You have a very bad headache.  You have neck pain.  Your neck is stiff.  You throw up (vomit).  You feel very sleepy.  You see red streaks coming from the affected area.  Your bone or joint underneath the affected area becomes painful after the skin has healed.  The affected area turns darker.  You have trouble breathing. This information is not intended to replace advice given to you by your health care provider. Make sure you discuss any questions you have with your health care provider. Document Released: 06/09/2009 Document Revised: 01/18/2016 Document Reviewed: 12/28/2014 Elsevier Interactive Patient Education  2018 [employer]

## 2017-10-22 ENCOUNTER — Other Ambulatory Visit: Payer: Self-pay | Admitting: Gastroenterology

## 2017-10-22 ENCOUNTER — Other Ambulatory Visit: Payer: Self-pay | Admitting: Family Medicine

## 2017-10-27 ENCOUNTER — Telehealth: Payer: Self-pay | Admitting: *Deleted

## 2017-10-27 DIAGNOSIS — J029 Acute pharyngitis, unspecified: Secondary | ICD-10-CM | POA: Diagnosis not present

## 2017-10-27 DIAGNOSIS — J069 Acute upper respiratory infection, unspecified: Secondary | ICD-10-CM | POA: Diagnosis not present

## 2017-10-27 NOTE — Telephone Encounter (Signed)
Copied from CRM [phone]. Topic: General - Other >> Oct 27, 2017  8:32 AM Gerrianne ScalePayne, Angela L wrote: Reason for CRM: patient wanted toi come into office to be seen but I advised him there was no openings and advised hi that [facility] can [facility] or the urgent care in [place] pt declined at this time he would like for a antibiotic for a sore throat chest and nasal congestion I advised him that he may not be able to get RX if he hasn't been seen for symptoms pt understands

## 2017-10-27 NOTE — Telephone Encounter (Signed)
Please advise 

## 2017-10-28 NOTE — Telephone Encounter (Signed)
fyi

## 2017-10-28 NOTE — Telephone Encounter (Signed)
Left message to return cal, ok for pec to speak to patient and find out symptoms and how long he has had them. He needs to be evaluated.

## 2017-10-28 NOTE — Telephone Encounter (Signed)
He needs to be evaluated for this. Please find out what symptoms he is having and how long he has had them for. Thanks.

## 2017-11-03 NOTE — Telephone Encounter (Signed)
Patient did not return call.

## 2017-12-21 IMAGING — DX DG CHEST 2V
2 series · 2 of 2 positions shown · non-contrast
Comparison: None in PACs

CLINICAL DATA: Chest congestion and cough for the past week with no
fever chest pain or shortness of breath. Former smoker.

EXAM:
CHEST  2 VIEW

[chest pa]
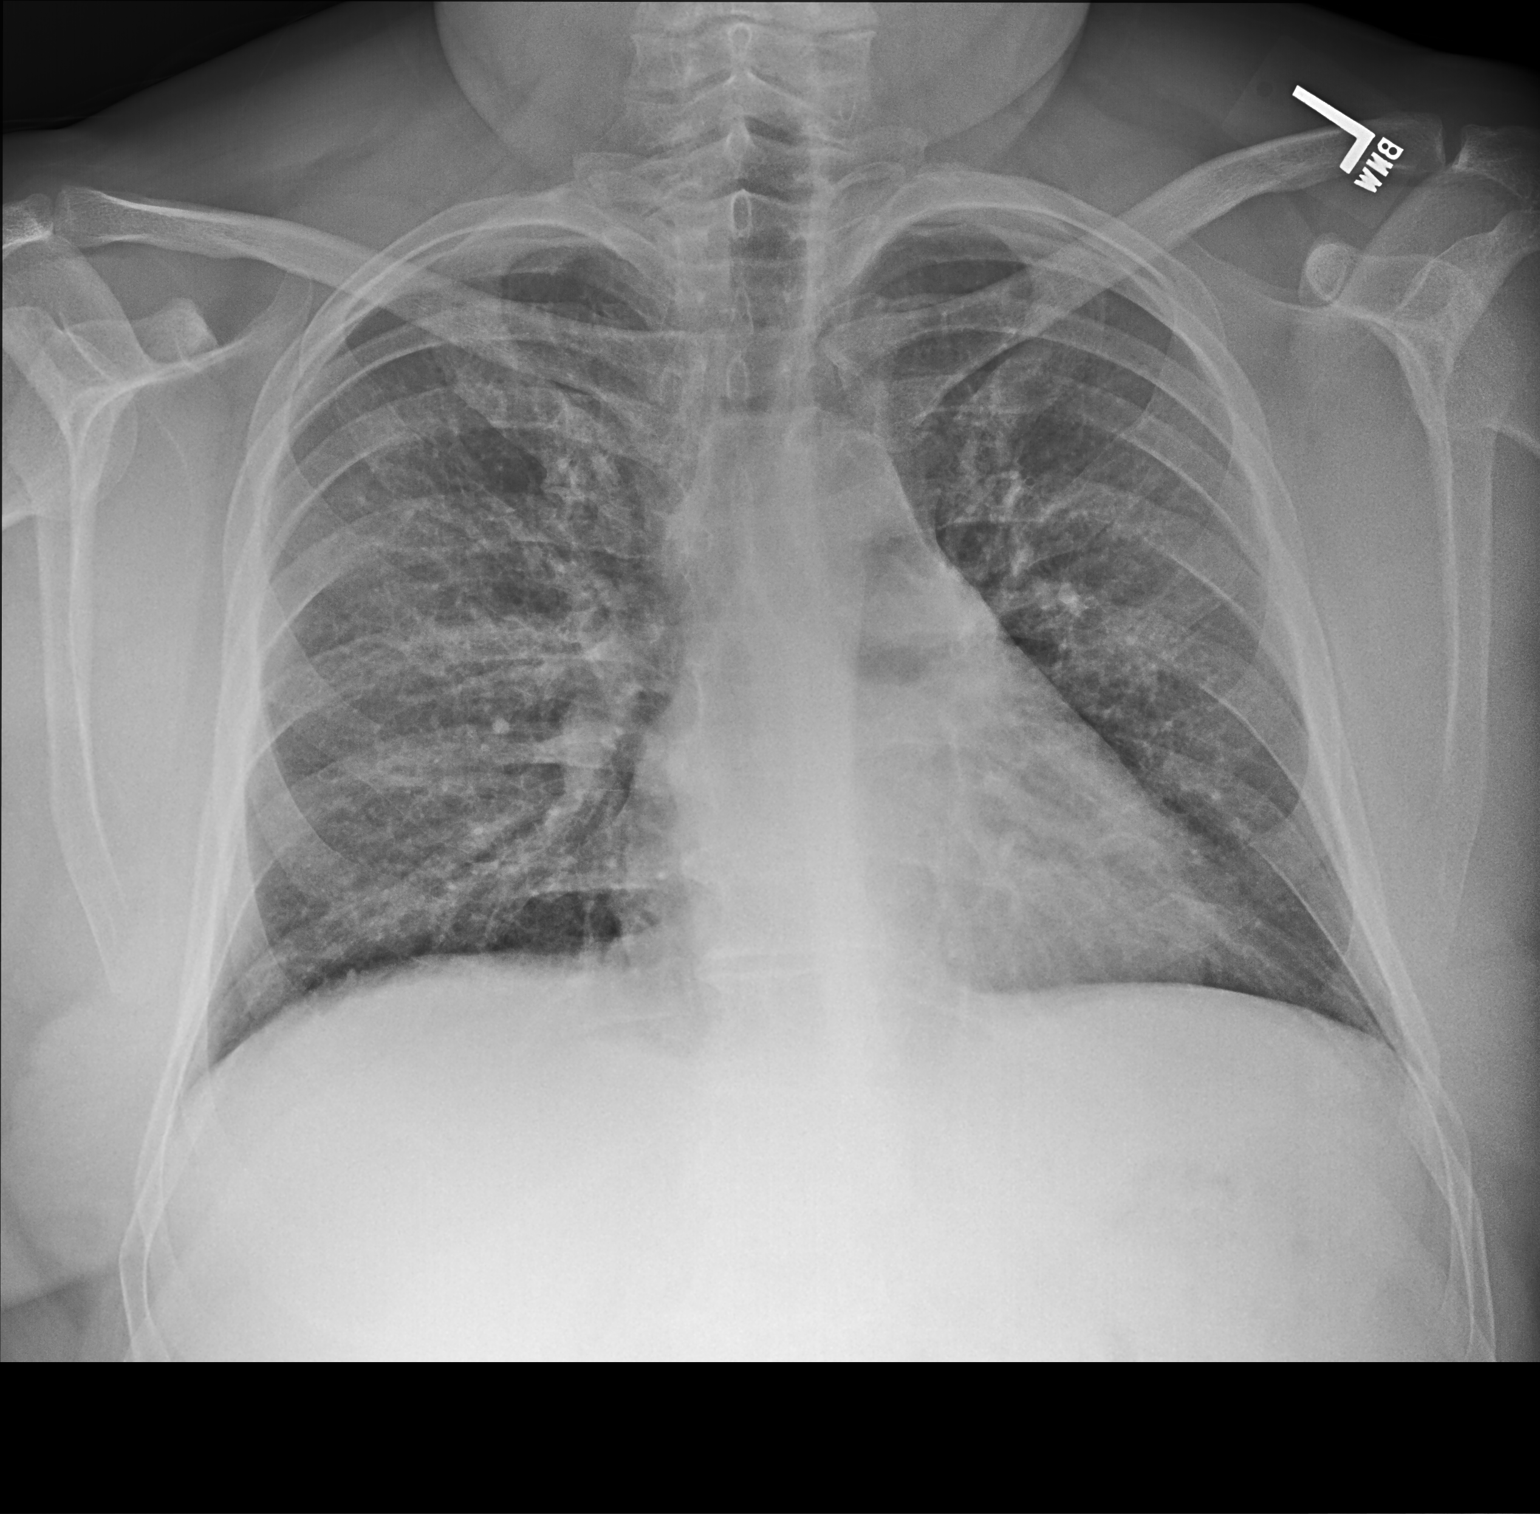

[chest lat]
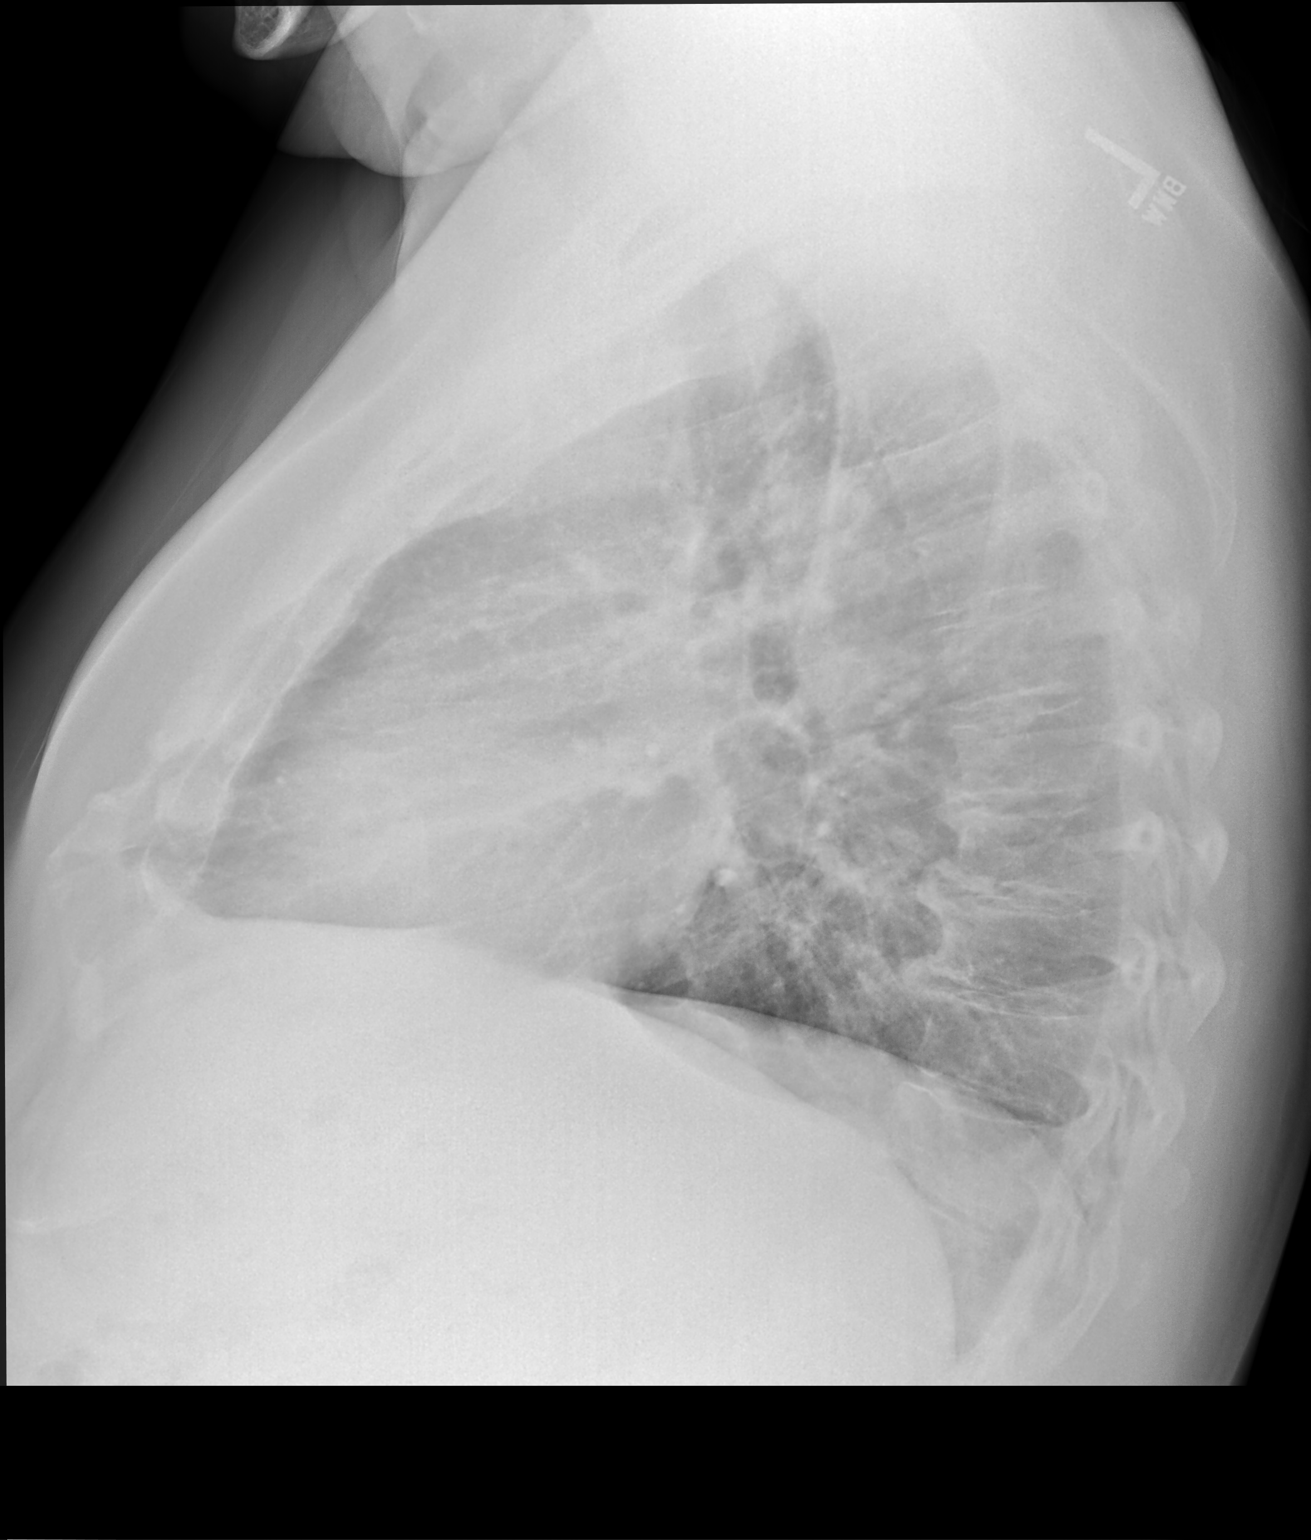

[2 of 2 positions shown; findings below may reference images not displayed]

FINDINGS: The lungs are reasonably well inflated. The interstitial markings
are mildly increased bilaterally. There is no pleural effusion. The
heart and pulmonary vascularity are normal. The mediastinum is
normal in width. The bony thorax exhibits no acute abnormality.
IMPRESSION: Mild diffuse interstitial prominence which may reflect pneumonitis
or early interstitial pneumonia. No alveolar pneumonia nor pulmonary
edema.

Followup PA and lateral chest X-ray is recommended in 3-4 weeks
following trial of antibiotic therapy to ensure resolution.

## 2018-01-19 IMAGING — DX DG CHEST 2V
2 series · 2 of 2 positions shown · non-contrast
Comparison: 09/23/2016.

CLINICAL DATA: Community acquired pneumonia.

EXAM:
CHEST  2 VIEW

[chest pa]
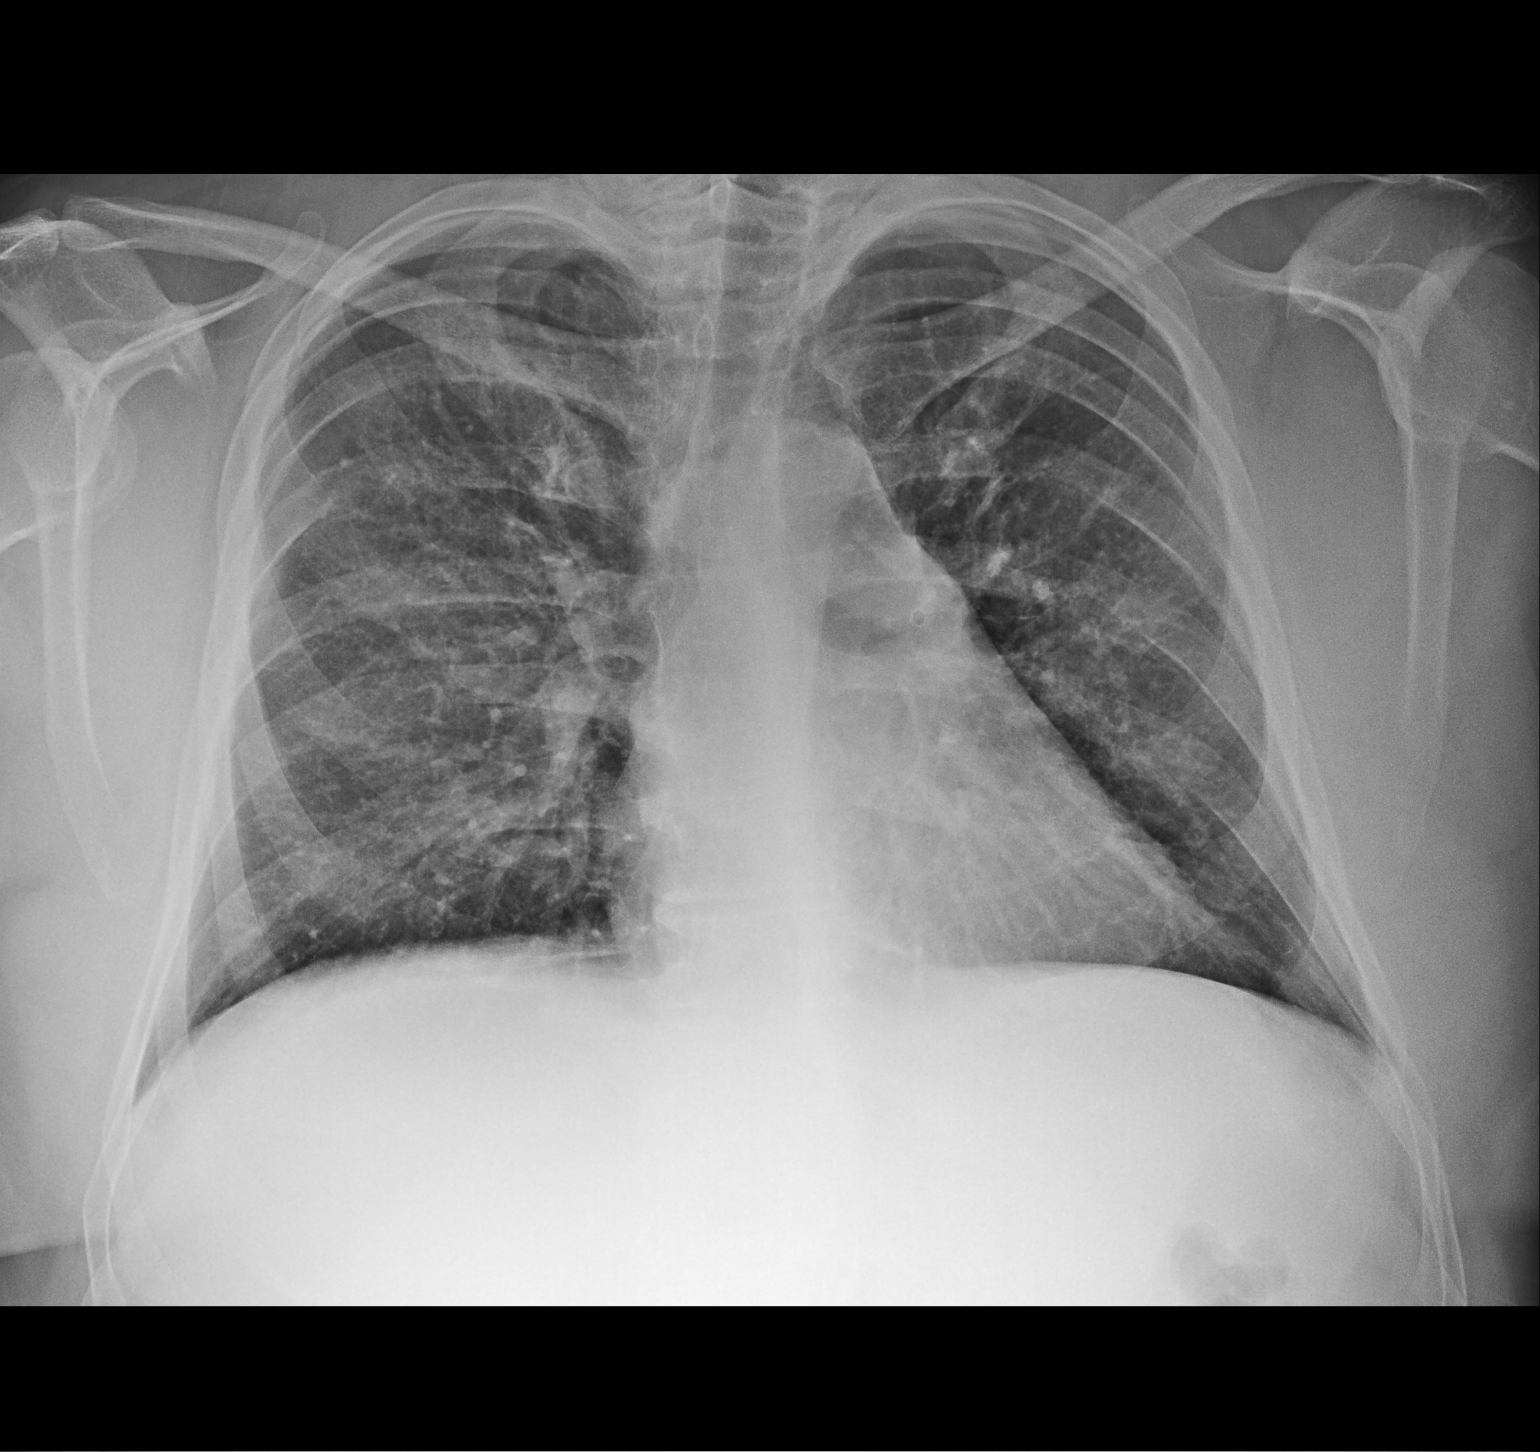

[chest lat]
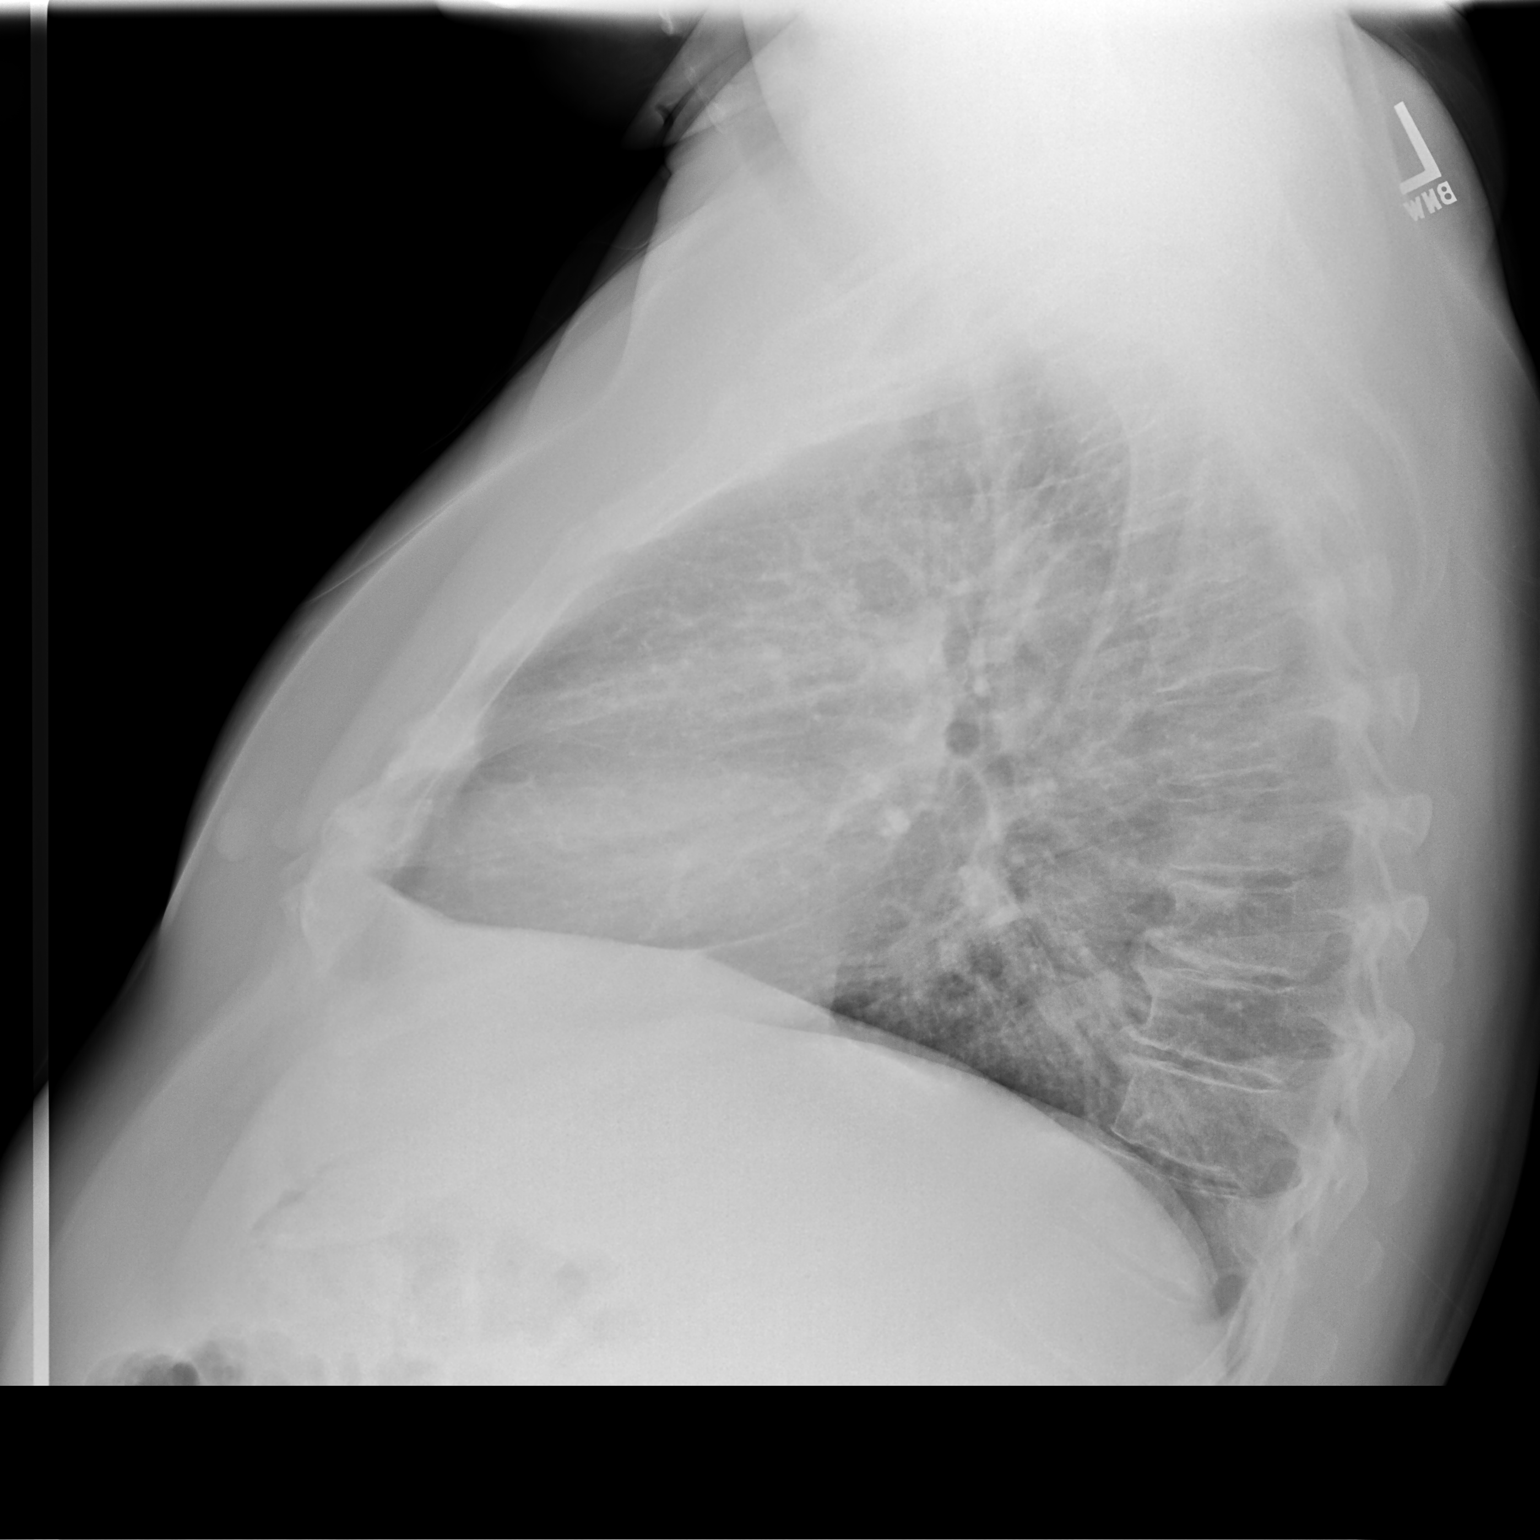

[2 of 2 positions shown; findings below may reference images not displayed]

FINDINGS: Mediastinum hilar structures normal. Heart size normal. Diffuse mild
interstitial prominence again noted . A component of chronic
interstitial lung disease B pressed. Active interstitial pneumonitis
cannot be excluded. No pleural effusion or pneumothorax. No acute
bony abnormality.
IMPRESSION: Diffuse mild interstitial prominence again noted without significant
change. Component chronic interstitial lung disease may be present.
Active pneumonitis cannot be excluded.

## 2018-02-14 DIAGNOSIS — H5213 Myopia, bilateral: Secondary | ICD-10-CM | POA: Diagnosis not present

## 2018-04-06 DIAGNOSIS — J0101 Acute recurrent maxillary sinusitis: Secondary | ICD-10-CM | POA: Diagnosis not present

## 2022-12-01 ENCOUNTER — Telehealth: Payer: BC Managed Care – PPO | Admitting: Physician Assistant

## 2022-12-01 DIAGNOSIS — H9201 Otalgia, right ear: Secondary | ICD-10-CM

## 2022-12-02 ENCOUNTER — Telehealth: Payer: BC Managed Care – PPO | Admitting: Physician Assistant

## 2022-12-02 DIAGNOSIS — J019 Acute sinusitis, unspecified: Secondary | ICD-10-CM | POA: Diagnosis not present

## 2022-12-02 DIAGNOSIS — B9689 Other specified bacterial agents as the cause of diseases classified elsewhere: Secondary | ICD-10-CM | POA: Diagnosis not present

## 2022-12-02 MED ORDER — AMOXICILLIN-POT CLAVULANATE 875-125 MG PO TABS: 1.0000 | ORAL_TABLET | Freq: Two times a day (BID) | ORAL | 0 refills | Status: AC

## 2022-12-02 MED ORDER — FLUTICASONE PROPIONATE 50 MCG/ACT NA SUSP: 2.0000 | Freq: Every day | NASAL | 0 refills | Status: AC

## 2022-12-02 NOTE — Patient Instructions (Signed)
Laureen Ochs, thank you for joining Piedad Climes, PA-C for today's virtual visit.  While this provider is not your primary care provider (PCP), if your PCP is located in our provider database this encounter information will be shared with them immediately following your visit.   A [place] MyChart account gives you access to today's visit and all your visits, tests, and labs performed at [facility] " click here if you don't have a [facility] MyChart account or go to [email or web address]  Consent: (Patient) Laureen Ochs provided verbal consent for this virtual visit at the beginning of the encounter.  Current Medications:  Current Outpatient Medications:    esomeprazole (NEXIUM) 40 MG capsule, take 1 capsule by mouth twice a day before meals, Disp: 60 capsule, Rfl: 11   QUEtiapine (SEROQUEL) 50 MG tablet, , Disp: , Rfl:    sertraline (ZOLOFT) 100 MG tablet, Take 100 mg by mouth daily., Disp: , Rfl:    triamcinolone ointment (KENALOG) 0.5 %, Apply 1 application topically 2 (two) times daily., Disp: 30 g, Rfl: 0   valACYclovir (VALTREX) 500 MG tablet, TAKE 1 TABLET BY MOUTH EVERY DAY, Disp: 90 tablet, Rfl: 0   Medications ordered in this encounter:  No orders of the defined types were placed in this encounter.    *If you need refills on other medications prior to your next appointment, please contact your pharmacy*  Follow-Up: Call back or seek an in-person evaluation if the symptoms worsen or if the condition fails to improve as anticipated.  [facility] [phone]  Other Instructions Please take antibiotic as directed.  Increase fluid intake.  Use Saline nasal spray.  Take a daily multivitamin. Use Zyrtec OTC and the prescribed Flonase spray as directed.  Place a humidifier in the bedroom.  Please call or return clinic if symptoms are not improving.  Sinusitis Sinusitis is redness, soreness, and swelling (inflammation) of the paranasal  sinuses. Paranasal sinuses are air pockets within the bones of your face (beneath the eyes, the middle of the forehead, or above the eyes). In healthy paranasal sinuses, mucus is able to drain out, and air is able to circulate through them by way of your nose. However, when your paranasal sinuses are inflamed, mucus and air can become trapped. This can allow bacteria and other germs to grow and cause infection. Sinusitis can develop quickly and last only a short time (acute) or continue over a long period (chronic). Sinusitis that lasts for more than 12 weeks is considered chronic.  CAUSES  Causes of sinusitis include: Allergies. Structural abnormalities, such as displacement of the cartilage that separates your nostrils (deviated septum), which can decrease the air flow through your nose and sinuses and affect sinus drainage. Functional abnormalities, such as when the small hairs (cilia) that line your sinuses and help remove mucus do not work properly or are not present. SYMPTOMS  Symptoms of acute and chronic sinusitis are the same. The primary symptoms are pain and pressure around the affected sinuses. Other symptoms include: Upper toothache. Earache. Headache. Bad breath. Decreased sense of smell and taste. A cough, which worsens when you are lying flat. Fatigue. Fever. Thick drainage from your nose, which often is green and may contain pus (purulent). Swelling and warmth over the affected sinuses. DIAGNOSIS  Your caregiver will perform a physical exam. During the exam, your caregiver may: Look in your nose for signs of abnormal growths in your nostrils (nasal polyps). Tap over the affected sinus  to check for signs of infection. View the inside of your sinuses (endoscopy) with a special imaging device with a light attached (endoscope), which is inserted into your sinuses. If your caregiver suspects that you have chronic sinusitis, one or more of the following tests may be  recommended: Allergy tests. Nasal culture A sample of mucus is taken from your nose and sent to a lab and screened for bacteria. Nasal cytology A sample of mucus is taken from your nose and examined by your caregiver to determine if your sinusitis is related to an allergy. TREATMENT  Most cases of acute sinusitis are related to a viral infection and will resolve on their own within 10 days. Sometimes medicines are prescribed to help relieve symptoms (pain medicine, decongestants, nasal steroid sprays, or saline sprays).  However, for sinusitis related to a bacterial infection, your caregiver will prescribe antibiotic medicines. These are medicines that will help kill the bacteria causing the infection.  Rarely, sinusitis is caused by a fungal infection. In theses cases, your caregiver will prescribe antifungal medicine. For some cases of chronic sinusitis, surgery is needed. Generally, these are cases in which sinusitis recurs more than 3 times per year, despite other treatments. HOME CARE INSTRUCTIONS  Drink plenty of water. Water helps thin the mucus so your sinuses can drain more easily. Use a humidifier. Inhale steam 3 to 4 times a day (for example, sit in the bathroom with the shower running). Apply a warm, moist washcloth to your face 3 to 4 times a day, or as directed by your caregiver. Use saline nasal sprays to help moisten and clean your sinuses. Take over-the-counter or prescription medicines for pain, discomfort, or fever only as directed by your caregiver. SEEK IMMEDIATE MEDICAL CARE IF: You have increasing pain or severe headaches. You have nausea, vomiting, or drowsiness. You have swelling around your face. You have vision problems. You have a stiff neck. You have difficulty breathing. MAKE SURE YOU:  Understand these instructions. Will watch your condition. Will get help right away if you are not doing well or get worse. Document Released: 08/12/2005 Document Revised:  11/04/2011 Document Reviewed: 08/27/2011 [facility] Patient Information 2014 [place], [place].    If you have been instructed to have an in-person evaluation today at a local Urgent Care facility, please use the link below. It will take you to a list of all of our available [facility], including address, phone number and hours of operation. Please do not delay care.  [facility]  If you or a family member do not have a primary care provider, use the link below to schedule a visit and establish care. When you choose a [facility] primary care physician or advanced practice provider, you gain a long-term partner in health. Find a Primary Care Provider  Learn more about [facility]'s in-office and virtual care options: [place] [facility] Now

## 2022-12-02 NOTE — Progress Notes (Signed)
Virtual Visit Consent   Nathaniel Ball, you are scheduled for a virtual visit with a [facility] provider today. Just as with appointments in the office, your consent must be obtained to participate. Your consent will be active for this visit and any virtual visit you may have with one of our providers in the next 365 days. If you have a MyChart account, a copy of this consent can be sent to you electronically.  As this is a virtual visit, video technology does not allow for your provider to perform a traditional examination. This may limit your provider's ability to fully assess your condition. If your provider identifies any concerns that need to be evaluated in person or the need to arrange testing (such as labs, EKG, etc.), we will make arrangements to do so. Although advances in technology are sophisticated, we cannot ensure that it will always work on either your end or our end. If the connection with a video visit is poor, the visit may have to be switched to a telephone visit. With either a video or telephone visit, we are not always able to ensure that we have a secure connection.  By engaging in this virtual visit, you consent to the provision of healthcare and authorize for your insurance to be billed (if applicable) for the services provided during this visit. Depending on your insurance coverage, you may receive a charge related to this service.  I need to obtain your verbal consent now. Are you willing to proceed with your visit today? Nathaniel Ball has provided verbal consent on 12/02/2022 for a virtual visit (video or telephone). Piedad Climes, [place]  Date: 12/02/2022 5:09 PM  Virtual Visit via Video Note   I, Piedad Climes, connected with  Nathaniel Ball  ([id number], 03/04/1981) on 12/02/22 at  5:15 PM EDT by a video-enabled telemedicine application and verified that I am speaking with the correct person using two identifiers.  Location: Patient: Virtual Visit Location  Patient: Home Provider: Virtual Visit Location Provider: Home Office   I discussed the limitations of evaluation and management by telemedicine and the availability of in person appointments. The patient expressed understanding and agreed to proceed.    History of Present Illness: Nathaniel Ball is a 42 y.o. who identifies as a male who was assigned male at birth, and is being seen today for pain in his R ear that he thought was related to a tooth initially but without dental pain or swelling. Has developed nasal and head congestion with sinus pressure and sinus pain. R ear pain is now more persistent. Denies fever, chills, hearing changes. Denies drainage from the ear or tinnitus. Denies symptoms of L ear.   Took an OTC zyrtec this morning.     HPI: HPI  Problems:  Patient Active Problem List   Diagnosis Date Noted   Community acquired pneumonia 10/22/2016   Periodic limb movement disorder 01/09/2016   Routine general medical examination at a health care facility 02/12/2015   Bipolar disorder 02/12/2015   Other chest pain 04/27/2013   History of herpes labialis 09/28/2012   Genital warts 09/28/2012    Allergies: No Known Allergies Medications:  Current Outpatient Medications:    amoxicillin-clavulanate (AUGMENTIN) 875-125 MG tablet, Take 1 tablet by mouth 2 (two) times daily., Disp: 14 tablet, Rfl: 0   fluticasone (FLONASE) 50 MCG/ACT nasal spray, Place 2 sprays into both nostrils daily., Disp: 16 g, Rfl: 0   esomeprazole (NEXIUM) 40 MG capsule, take  1 capsule by mouth twice a day before meals, Disp: 60 capsule, Rfl: 11  Observations/Objective: Patient is well-developed, well-nourished in no acute distress.  Resting comfortably at home.  Head is normocephalic, atraumatic.  No labored breathing.  Speech is clear and coherent with logical content.  Patient is alert and oriented at baseline.   Assessment and Plan: 1. Acute bacterial sinusitis - fluticasone (FLONASE) 50 MCG/ACT  nasal spray; Place 2 sprays into both nostrils daily.  Dispense: 16 g; Refill: 0 - amoxicillin-clavulanate (AUGMENTIN) 875-125 MG tablet; Take 1 tablet by mouth 2 (two) times daily.  Dispense: 14 tablet; Refill: 0  With concern for otitis media. Rx Augmentin.  Increase fluids.  Rest.  Saline nasal spray.  Probiotic.  Mucinex as directed.  Humidifier in bedroom. Flonase per orders. Zyrtec OTC.  Call or return to clinic if symptoms are not improving.   Follow Up Instructions: I discussed the assessment and treatment plan with the patient. The patient was provided an opportunity to ask questions and all were answered. The patient agreed with the plan and demonstrated an understanding of the instructions.  A copy of instructions were sent to the patient via MyChart unless otherwise noted below.   The patient was advised to call back or seek an in-person evaluation if the symptoms worsen or if the condition fails to improve as anticipated.  Time:  I spent 10 minutes with the patient via telehealth technology discussing the above problems/concerns.    Piedad Climes, PA-C

## 2022-12-02 NOTE — Progress Notes (Signed)
   Thank you for the details you included in the comment boxes. Those details are very helpful in determining the best course of treatment for you and help [place] to provide the best care.Because you are not having other symptoms that help differentiate different causes of ear pain to properly treat via an e-visit, we recommend that you convert this visit to a video visit in order for the provider to better assess what is going on.  The provider will be able to give you a more accurate diagnosis and treatment plan if we can more freely discuss your symptoms and with the addition of a virtual examination.   If you convert to a video visit, we will bill your insurance (similar to an office visit) and you will not be charged for this e-Visit. You will be able to stay at home and speak with the first available [facility] advanced practice provider. The link to do a video visit is in the drop down Menu tab of your Welcome screen in MyChart.

## 2023-03-19 ENCOUNTER — Telehealth: Payer: BC Managed Care – PPO | Admitting: Physician Assistant

## 2023-03-19 DIAGNOSIS — U071 COVID-19: Secondary | ICD-10-CM

## 2023-03-19 DIAGNOSIS — Z029 Encounter for administrative examinations, unspecified: Secondary | ICD-10-CM

## 2023-03-19 NOTE — Patient Instructions (Signed)
Nathaniel Ball, thank you for joining Roney Jaffe, PA-C for today's virtual visit.  While this provider is not your primary care provider (PCP), if your PCP is located in our provider database this encounter information will be shared with them immediately following your visit.   A [facility] MyChart account gives you access to today's visit and all your visits, tests, and labs performed at [facility] " click here if you don't have a [facility] MyChart account or go to [email or web address]  Consent: (Patient) Nathaniel Ball provided verbal consent for this virtual visit at the beginning of the encounter.  Current Medications:  Current Outpatient Medications:    amoxicillin-clavulanate (AUGMENTIN) 875-125 MG tablet, Take 1 tablet by mouth 2 (two) times daily., Disp: 14 tablet, Rfl: 0   esomeprazole (NEXIUM) 40 MG capsule, take 1 capsule by mouth twice a day before meals, Disp: 60 capsule, Rfl: 11   fluticasone (FLONASE) 50 MCG/ACT nasal spray, Place 2 sprays into both nostrils daily., Disp: 16 g, Rfl: 0   Medications ordered in this encounter:  No orders of the defined types were placed in this encounter.    *If you need refills on other medications prior to your next appointment, please contact your pharmacy*  Follow-Up: Call back or seek an in-person evaluation if the symptoms worsen or if the condition fails to improve as anticipated.  [facility] [phone]  Other Instructions Infection Prevention in the Home If you have an infection, may have been exposed to an infection, or are taking care of someone who has an infection, it is important to know how to keep the infection from spreading. Follow your health care provider's instructions and use these guidelines to help stop the spread of infection. How infections are spread In order for an infection to spread, the following must be present: A germ. This may be a virus, bacteria, fungus, or  parasite. A place for the germ to live. This may be: On or in a person, animal, plant, or food. In soil or water. On surfaces, such as a door handle. A person or animal who can develop a disease if the germ enters the body (host). The host does not have resistance to the germ. A way for the germ to enter the host. This may occur by: Direct contact with an infected person or animal. This can happen through shaking hands or hugging. Some germs can also travel through the air and spread to others. This can happen when an infected person coughs or sneezes on or near other people. Indirect contact. This occurs when the germ enters the host through contact with an infected object. Examples include: Eating or drinking food or water that is contaminated with the germ. Touching a contaminated surface with your hands, and then touching your face, eyes, nose, or mouth. Supplies needed: Soap. Alcohol-based hand sanitizer. Standard cleaning products. Disinfectants, such as bleach. Reusable cleaning cloths, sponges, or paper towels. Disposable or reusable utility gloves. How to prevent infection from spreading There are several things that you can do to help prevent infection from spreading. Take these general actions Everyone should take the following actions to prevent the spread of infection: Wash your hands often with soap and water for at least 20 seconds. If soap and water are not available, use alcohol-based hand sanitizer. Avoid touching your face, mouth, nose, or eyes. Cough or sneeze into a tissue, sleeve, or elbow instead of into your hand or into the air. If  you cough or sneeze into a tissue, throw it away immediately and wash your hands.  Keep your bathroom clean Provide soap. Change towels and washcloths frequently. Change toothbrushes often and store them separately in a clean, dry place. Clean and disinfect all surfaces, including the toilet, floor, tub, shower, and sink. Do not  share personal items, such as razors, toothbrushes, deodorant, combs, brushes, towels, and washcloths. Maintain hygiene in the [facility] your hands before and after preparing food and before you eat. Clean the inside of your refrigerator each week. Keep your refrigerator set at 35F (4C) or less, and set your freezer at 19F (-18C) or less. Keep work surfaces clean. Disinfect them regularly. Wash your dishes in hot, soapy water. Air-dry your dishes or use a dishwasher. Do not share dishes or eating utensils. Handle food safely Store food carefully. Refrigerate leftovers promptly in covered containers. Throw out stale or spoiled food. Thaw foods in the refrigerator or microwave, not at room temperature. Serve foods at the proper temperature. Do not eat raw meat. Make sure it is cooked to the appropriate temperature. Cook eggs until they are firm. Wash fruits and vegetables under running water. Use separate cutting boards, plates, and utensils for raw foods and cooked foods. Use a clean spoon each time you sample food while cooking. Do laundry the right way Wear gloves if laundry is visibly soiled. Do not shake soiled laundry. Doing that may send germs into the air. Wash laundry in hot water. If you cannot wash the laundry right away, place it in a plastic bag and wash it as soon as possible. Be careful around animals and pets Wash your hands before and after touching animals. If you have a pet, ensure that your pet stays clean. Do not let people with weak immune systems touch bird droppings, fish tank water, or a litter box. If you have a pet cage or litter box, be sure to clean it every day. If you are sick, stay away from animals and have someone else care for them if possible. How to clean and disinfect objects and surfaces Precautions Some disinfectants work for certain germs and not others. Read the manufacturer's instructions or read online resources to determine if the  product you are using will work for the germ you are trying to remove. If you choose to use bleach, use it safely. Never mix it with other cleaning products, especially those that contain ammonia. This mixture can create a dangerous gas that may be deadly. Keep proper movement of fresh air in your home (ventilation). Pour used mop water down the utility sink or toilet. Do not pour this water down the kitchen sink. Objects and surfaces  If surfaces are visibly soiled, clean them first with soap and water before disinfecting. Disinfect surfaces that are frequently touched every day. This may include: Counters. Tables. Doorknobs. Sinks and faucets. Electronics, such as: Engineer, technical sales. Remote controls. Keyboards. Computers and tablets. Cleaning supplies Some cleaning supplies can breed germs. Take good care of them to prevent germs from spreading. To do this: Soak toilet brushes, mops, and sponges in bleach and water for 5 minutes after each use, or according to manufacturer's instructions. Wash reusable cleaning cloths and sanitize sponges after each use. Throw away disposable gloves after one use. Replace reusable utility gloves if they are cracked or torn or if they start to peel. Additional actions if you are sick If you live with other people:  Avoid close contact with those around you. Stay  at least 3 ft (1 m) away from others, if possible. Use a separate bathroom, if possible. If possible, sleep in a separate bedroom or in a separate bed to prevent infecting other household members. Change bedroom linens each week or whenever they are soiled. Have everyone in the household wash hands often with soap and water for at least 20 seconds. If soap and water are not available, use alcohol-based hand sanitizer. In general: Stay home except to get medical care. Call ahead before visiting your health care provider. Ask others to get groceries and household supplies and to refill prescriptions for  you. Avoid public areas. Try not to take public transportation. If you can, wear a mask if you need to go out of the house, or if you are in close contact with someone who is not sick. Avoid visitors until you have completely recovered, or until you have no signs and symptoms of infection. Avoid preparing food or providing care for others. If you must prepare food or provide care for others, wear a mask and wash your hands before and after doing these things. Where to find more information Centers for Disease Control and Prevention: TonerProm[employer].no Summary It is important to know how to keep infection from spreading. Make sure everyone in your household washes their hands often with soap and water. Disinfect surfaces that are frequently touched every day. If you are sick, stay home except to get medical care. This information is not intended to replace advice given to you by your health care provider. Make sure you discuss any questions you have with your health care provider. Document Revised: 10/01/2021 Document Reviewed: 10/01/2021 Elsevier Patient Education  2024 [employer].    If you have been instructed to have an in-person evaluation today at a local Urgent Care facility, please use the link below. It will take you to a list of all of our available [facility], including address, phone number and hours of operation. Please do not delay care.  [facility]  If you or a family member do not have a primary care provider, use the link below to schedule a visit and establish care. When you choose a [facility] primary care physician or advanced practice provider, you gain a long-term partner in health. Find a Primary Care Provider  Learn more about [facility]'s in-office and virtual care options: [facility] - Get Care Now

## 2023-03-20 ENCOUNTER — Encounter: Payer: Self-pay | Admitting: Physician Assistant

## 2023-03-20 NOTE — Progress Notes (Signed)
Subjective    Patient ID: Nathaniel Ball, male    DOB: 06-29-1981  Age: 42 y.o. MRN: [id number]  CC:  Chief Complaint  Patient presents with   FMLA     Positive COVID Screening, Sunday.   Virtual Visit via Video Note  I connected with Akif W Andreatta on 03/20/23 at 11:20 AM EDT by a video enabled telemedicine application and verified that I am speaking with the correct person using two identifiers.  Location: Patient: Vehicle  Provider: [facility]    I discussed the limitations of evaluation and management by telemedicine and the availability of in person appointments. The patient expressed understanding and agreed to proceed.  History of Present Illness: States that he started having cold-like symptoms late Friday night, states that he started feeling worse on Saturday and Sunday, states he was experiencing headache, measured temperature of 102, along with nausea.  States that his symptoms are starting to improve, however he still is experiencing headaches.  States that he is eating and drinking okay.  States that he has been using Tylenol, ibuprofen and DayQuil with some relief.  States that he did present to [facility] on Sunday and tested positive for COVID.  Request paperwork be completed for him for FMLA, does not currently have a primary care provider.   Observations/Objective: Medical history and current medications reviewed, no physical exam completed    Outpatient Encounter Medications as of 03/19/2023  Medication Sig   amoxicillin-clavulanate (AUGMENTIN) 875-125 MG tablet Take 1 tablet by mouth 2 (two) times daily.   esomeprazole (NEXIUM) 40 MG capsule take 1 capsule by mouth twice a day before meals   fluticasone (FLONASE) 50 MCG/ACT nasal spray Place 2 sprays into both nostrils daily.   No facility-administered encounter medications on file as of 03/19/2023.    Past Medical History:  Diagnosis Date   Bipolar disorder (HCC)    Chicken pox     Depression    Genital warts    2007   GERD (gastroesophageal reflux disease)     Past Surgical History:  Procedure Laterality Date   HERNIA REPAIR     20 05    Family History  Problem Relation Age of Onset   Diabetes Mother    Cancer Father        prostate   Heart disease Father 61       MI   Hypertension Father    Diabetes Father    Depression Father    Heart disease Maternal Grandfather    Heart disease Paternal Grandfather 21       Died MI    Social History   Socioeconomic History   Marital status: Married    Spouse name: Photographer   Number of children: Not on file   Years of education: 12   Highest education level: Not on file  Occupational History    Employer: [employer]  Tobacco Use   Smoking status: Former    Types: Cigarettes   Smokeless tobacco: Current    Types: Chew  Substance and Sexual Activity   Alcohol use: Yes    Comment: social   Drug use: No   Sexual activity: Not on file  Other Topics Concern   Not on file  Social History Narrative   Five year old and 3 month yo children   Works in an Theatre stage manager    Left handed    Caff   Social Determinants of Health   Financial Resource Strain: Not  on file  Food Insecurity: Not on file  Transportation Needs: Not on file  Physical Activity: Not on file  Stress: Not on file  Social Connections: Not on file  Intimate Partner Violence: Not on file    Review of Systems  Constitutional:  Positive for fever. Negative for chills.  HENT:  Negative for ear pain and sore throat.   Eyes: Negative.   Respiratory:  Negative for cough and shortness of breath.   Cardiovascular:  Negative for chest pain.  Gastrointestinal:  Positive for nausea. Negative for abdominal pain and vomiting.  Genitourinary: Negative.   Musculoskeletal:  Positive for myalgias.  Skin: Negative.   Neurological:  Positive for headaches.  Endo/Heme/Allergies: Negative.   Psychiatric/Behavioral: Negative.             Assessment & Plan:   Problem List Items Addressed This Visit   None Visit Diagnoses     COVID    -  Primary   Administrative encounter           Assessment and Plan: 1. COVID Patient education given on supportive care and proper isolation protocol and masking protocol.  Red flags given for prompt reevaluation  2. Administrative encounter Paperwork completed on patient's behalf.   Follow Up Instructions:    I discussed the assessment and treatment plan with the patient. The patient was provided an opportunity to ask questions and all were answered. The patient agreed with the plan and demonstrated an understanding of the instructions.   The patient was advised to call back or seek an in-person evaluation if the symptoms worsen or if the condition fails to improve as anticipated.  I provided 20 minutes of non-face-to-face time during this encounter.    Return if symptoms worsen or fail to improve.   Kasandra Knudsen Mayers, PA-C
